# Patient Record
Sex: Female | Born: 1970
Health system: Southern US, Community
[De-identification: ages and names within clinical notes are randomized; demographics above are authoritative.]

## PROBLEM LIST (undated history)

## (undated) DIAGNOSIS — E282 Polycystic ovarian syndrome: Secondary | ICD-10-CM

## (undated) DIAGNOSIS — E785 Hyperlipidemia, unspecified: Secondary | ICD-10-CM

## (undated) DIAGNOSIS — F39 Unspecified mood [affective] disorder: Secondary | ICD-10-CM

## (undated) DIAGNOSIS — E669 Obesity, unspecified: Secondary | ICD-10-CM

## (undated) DIAGNOSIS — Q909 Down syndrome, unspecified: Secondary | ICD-10-CM

## (undated) HISTORY — PX: CYSTOSCOPY: SUR368

## (undated) HISTORY — DX: Polycystic ovarian syndrome: E28.2

## (undated) HISTORY — DX: Unspecified mood (affective) disorder: F39

## (undated) HISTORY — DX: Down syndrome, unspecified: Q90.9

## (undated) HISTORY — DX: Hyperlipidemia, unspecified: E78.5

## (undated) HISTORY — DX: Obesity, unspecified: E66.9

---

## 1998-02-22 ENCOUNTER — Encounter: Admission: RE | Admit: 1998-02-22 | Discharge: 1998-02-22 | Payer: Self-pay | Admitting: Family Medicine

## 1998-03-28 ENCOUNTER — Encounter: Admission: RE | Admit: 1998-03-28 | Discharge: 1998-03-28 | Payer: Self-pay | Admitting: Family Medicine

## 1998-10-12 ENCOUNTER — Encounter: Admission: RE | Admit: 1998-10-12 | Discharge: 1998-10-12 | Payer: Self-pay | Admitting: Family Medicine

## 1999-02-07 ENCOUNTER — Encounter: Admission: RE | Admit: 1999-02-07 | Discharge: 1999-02-07 | Payer: Self-pay | Admitting: Sports Medicine

## 1999-08-15 ENCOUNTER — Encounter: Admission: RE | Admit: 1999-08-15 | Discharge: 1999-08-15 | Payer: Self-pay | Admitting: Sports Medicine

## 1999-10-05 ENCOUNTER — Encounter: Admission: RE | Admit: 1999-10-05 | Discharge: 1999-10-05 | Payer: Self-pay | Admitting: Family Medicine

## 2000-10-31 ENCOUNTER — Encounter: Admission: RE | Admit: 2000-10-31 | Discharge: 2000-10-31 | Payer: Self-pay | Admitting: Family Medicine

## 2000-11-26 ENCOUNTER — Encounter: Admission: RE | Admit: 2000-11-26 | Discharge: 2000-11-26 | Payer: Self-pay | Admitting: Family Medicine

## 2000-12-18 ENCOUNTER — Encounter: Admission: RE | Admit: 2000-12-18 | Discharge: 2000-12-18 | Payer: Self-pay | Admitting: Family Medicine

## 2001-11-03 ENCOUNTER — Encounter: Admission: RE | Admit: 2001-11-03 | Discharge: 2001-11-03 | Payer: Self-pay | Admitting: Family Medicine

## 2002-12-07 ENCOUNTER — Encounter: Admission: RE | Admit: 2002-12-07 | Discharge: 2002-12-07 | Payer: Self-pay | Admitting: Family Medicine

## 2004-02-09 ENCOUNTER — Encounter: Admission: RE | Admit: 2004-02-09 | Discharge: 2004-02-09 | Payer: Self-pay | Admitting: Family Medicine

## 2004-02-25 ENCOUNTER — Encounter: Admission: RE | Admit: 2004-02-25 | Discharge: 2004-02-25 | Payer: Self-pay | Admitting: Sports Medicine

## 2005-03-21 ENCOUNTER — Ambulatory Visit: Payer: Self-pay | Admitting: Family Medicine

## 2006-04-11 ENCOUNTER — Ambulatory Visit: Payer: Self-pay | Admitting: Family Medicine

## 2006-10-31 DIAGNOSIS — E782 Mixed hyperlipidemia: Secondary | ICD-10-CM

## 2007-08-15 ENCOUNTER — Ambulatory Visit: Payer: Self-pay | Admitting: Family Medicine

## 2007-08-19 ENCOUNTER — Encounter (INDEPENDENT_AMBULATORY_CARE_PROVIDER_SITE_OTHER): Payer: Self-pay | Admitting: Family Medicine

## 2007-08-19 ENCOUNTER — Ambulatory Visit: Payer: Self-pay | Admitting: Family Medicine

## 2007-08-19 LAB — CONVERTED CEMR LAB
ALT: 18 units/L (ref 0–35)
AST: 15 units/L (ref 0–37)
Creatinine, Ser: 1.01 mg/dL (ref 0.40–1.20)
LDL Cholesterol: 150 mg/dL — ABNORMAL HIGH (ref 0–99)
Total Bilirubin: 0.3 mg/dL (ref 0.3–1.2)
Total CHOL/HDL Ratio: 4
VLDL: 39 mg/dL (ref 0–40)

## 2007-09-11 ENCOUNTER — Telehealth: Payer: Self-pay | Admitting: *Deleted

## 2008-11-10 ENCOUNTER — Encounter: Payer: Self-pay | Admitting: Family Medicine

## 2008-11-10 ENCOUNTER — Ambulatory Visit: Payer: Self-pay | Admitting: Family Medicine

## 2008-11-10 DIAGNOSIS — E282 Polycystic ovarian syndrome: Secondary | ICD-10-CM | POA: Insufficient documentation

## 2008-11-10 DIAGNOSIS — R5381 Other malaise: Secondary | ICD-10-CM | POA: Insufficient documentation

## 2008-11-10 DIAGNOSIS — Q909 Down syndrome, unspecified: Secondary | ICD-10-CM | POA: Insufficient documentation

## 2008-11-10 DIAGNOSIS — R5383 Other fatigue: Secondary | ICD-10-CM

## 2008-11-16 LAB — CONVERTED CEMR LAB
ALT: 8 units/L (ref 0–35)
AST: 13 units/L (ref 0–37)
Band Neutrophils: 0 % (ref 0–10)
Calcium: 9.1 mg/dL (ref 8.4–10.5)
Chloride: 103 meq/L (ref 96–112)
Creatinine, Ser: 0.87 mg/dL (ref 0.40–1.20)
Eosinophils Absolute: 0 10*3/uL (ref 0.0–0.7)
LDL Cholesterol: 137 mg/dL — ABNORMAL HIGH (ref 0–99)
Lymphocytes Relative: 28 % (ref 12–46)
Lymphs Abs: 1.3 10*3/uL (ref 0.7–4.0)
Neutrophils Relative %: 65 % (ref 43–77)
Potassium: 4.2 meq/L (ref 3.5–5.3)
RBC: 4.24 M/uL (ref 3.87–5.11)
TSH: 1.183 microintl units/mL (ref 0.350–4.500)
Total CHOL/HDL Ratio: 3.7
WBC: 4.8 10*3/uL (ref 4.0–10.5)

## 2008-11-23 ENCOUNTER — Telehealth (INDEPENDENT_AMBULATORY_CARE_PROVIDER_SITE_OTHER): Payer: Self-pay | Admitting: Family Medicine

## 2010-11-09 ENCOUNTER — Encounter: Payer: Self-pay | Admitting: Family Medicine

## 2010-11-30 ENCOUNTER — Ambulatory Visit (INDEPENDENT_AMBULATORY_CARE_PROVIDER_SITE_OTHER): Payer: Medicare Other | Admitting: Family Medicine

## 2010-11-30 ENCOUNTER — Telehealth: Payer: Self-pay | Admitting: Family Medicine

## 2010-11-30 DIAGNOSIS — Z79899 Other long term (current) drug therapy: Secondary | ICD-10-CM | POA: Insufficient documentation

## 2010-11-30 DIAGNOSIS — E7800 Pure hypercholesterolemia, unspecified: Secondary | ICD-10-CM

## 2010-11-30 DIAGNOSIS — Z0000 Encounter for general adult medical examination without abnormal findings: Secondary | ICD-10-CM

## 2010-11-30 DIAGNOSIS — E282 Polycystic ovarian syndrome: Secondary | ICD-10-CM

## 2010-11-30 NOTE — Telephone Encounter (Signed)
Mother had an FL2 form that I completed today, I forgot to put  My NPI in the physicians authorization number spot at the top of the form. Please give this to mother to complete. When she calls back

## 2010-11-30 NOTE — Patient Instructions (Addendum)
I will call you with lab results once I get the ultrasound to review as well Please set up her Mammogram Please set up her Eye appt and Dental Visit Stop the birth control pill for now  Next Physical in 1 year

## 2010-11-30 NOTE — Progress Notes (Signed)
  Subjective:    Patient ID: [name], female    DOB: 1971-01-07, 40 y.o.   MRN: [id number]  HPI Here for physical Exam- does not get PAP Smears-          Due for Mammogram  Seen by [facility] by Julio Sicks NP last year, for exam    Ultrasound performed saw small fibroids, did not comment on ovarian cysts, mother is still not sure if she has diagnosis of PCOS and what to do about hirsutism on chin. No hair elsewhere such as chest/back. Has been on OCP since teenager, more or less to protect her as she has a physical disability. Wants to know if she can take a break off the OCP  Seen by Dr. Isabel Caprice -- 1 week ago , diagnosed with UTI  Given Macrobid   Planning for Cystoscopy with Dr. Porfirio Oar to pain with urination, inability to empty bladder completely and Has some incontinence of feces occ with bladder symptoms. No blood in stool  Mental Health- has mood disorder and insomnia associated with her Down syndrome, has been on mood stabilizers for years, gets yearly blood work done    LMP- currently , has menses every month, sometimes heavier than others  Review of Systems per above, no recent illness, no fever, no emesis, no pain     Objective:   Physical Exam   GEN- NAD, alert and oriented, Down syndrome facies   HEENT- PERRL,EOMI, small pupils unable to complete fundoscopic exam, MMM, fair dentition   Neck -Supple   CVS- RRR, no murmur   RESP- CTAB   ABD- NABS, soft NT/ND, no CVA tenderness  EXT- no edema, pulses 2+  Skin- hirsutism of chin only, no hair on ext or trunk, no genitial hair (per report)  Declined GYN exam       Assessment & Plan:   CPE- PAP not performed as this is traumatic for pt, mother to schedule Mammogram  PCOS- pt has criteria of obesity, hirsutism, has normal periods, will obtain copy of Ultrasound regarding cystic appearance of ovaries. Pt appears to have not reached full tanner staging as a child with her Down syndrome. Will obtain FSH,  TSH, prolactin, Free TEST, DHEA. Metformin has not been shown to help with hirsutism and the OCP have not changed this, possibility of the use of spironolactone but this medications may be difficult for her because of frequent blood draws, currently low normotensive.  - will give holiday off OCP for approx 6 months Will likely need some GYN input about her situation  Hyperlipidemia- recheck FLP, currently not on meds, goal would be LDL < 160 , no other risk factors. Consider adding fish oil  FL2 form completed

## 2010-12-01 ENCOUNTER — Encounter: Payer: Self-pay | Admitting: Family Medicine

## 2010-12-01 LAB — COMPREHENSIVE METABOLIC PANEL
Albumin: 4 g/dL (ref 3.5–5.2)
BUN: 15 mg/dL (ref 6–23)
CO2: 18 mEq/L — ABNORMAL LOW (ref 19–32)
Calcium: 9.2 mg/dL (ref 8.4–10.5)
Chloride: 103 mEq/L (ref 96–112)
Creat: 0.98 mg/dL (ref 0.40–1.20)
Glucose, Bld: 63 mg/dL — ABNORMAL LOW (ref 70–99)
Potassium: 4 mEq/L (ref 3.5–5.3)

## 2010-12-01 LAB — CBC
HCT: 47.2 % — ABNORMAL HIGH (ref 36.0–46.0)
Hemoglobin: 15.4 g/dL — ABNORMAL HIGH (ref 12.0–15.0)
WBC: 8.7 10*3/uL (ref 4.0–10.5)

## 2010-12-01 LAB — TESTOSTERONE, FREE, TOTAL, SHBG: Testosterone-% Free: 0.4 % (ref 0.4–2.4)

## 2010-12-01 LAB — LIPID PANEL
Cholesterol: 248 mg/dL — ABNORMAL HIGH (ref 0–200)
Total CHOL/HDL Ratio: 3.6 Ratio
Triglycerides: 133 mg/dL (ref <150)
VLDL: 27 mg/dL (ref 0–40)

## 2010-12-01 LAB — TSH: TSH: 1.141 u[IU]/mL (ref 0.350–4.500)

## 2010-12-01 LAB — FOLLICLE STIMULATING HORMONE: FSH: 4.2 m[IU]/mL

## 2010-12-01 NOTE — Telephone Encounter (Signed)
Left another message, give my NPI number to mother when she calls

## 2010-12-05 LAB — DHEA

## 2010-12-06 ENCOUNTER — Ambulatory Visit (HOSPITAL_BASED_OUTPATIENT_CLINIC_OR_DEPARTMENT_OTHER)
Admission: RE | Admit: 2010-12-06 | Discharge: 2010-12-06 | Disposition: A | Discharge status: Home / Self Care | Payer: Medicare Other | Source: Ambulatory Visit | Attending: Urology | Admitting: Urology

## 2010-12-06 DIAGNOSIS — N35919 Unspecified urethral stricture, male, unspecified site: Secondary | ICD-10-CM | POA: Insufficient documentation

## 2010-12-06 DIAGNOSIS — Z79899 Other long term (current) drug therapy: Secondary | ICD-10-CM | POA: Insufficient documentation

## 2010-12-06 DIAGNOSIS — N3020 Other chronic cystitis without hematuria: Secondary | ICD-10-CM | POA: Insufficient documentation

## 2010-12-06 DIAGNOSIS — N319 Neuromuscular dysfunction of bladder, unspecified: Secondary | ICD-10-CM | POA: Insufficient documentation

## 2010-12-06 DIAGNOSIS — Z01812 Encounter for preprocedural laboratory examination: Secondary | ICD-10-CM | POA: Insufficient documentation

## 2010-12-06 DIAGNOSIS — Q909 Down syndrome, unspecified: Secondary | ICD-10-CM | POA: Insufficient documentation

## 2010-12-07 LAB — POCT HEMOGLOBIN-HEMACUE: Hemoglobin: 14.4 g/dL (ref 12.0–15.0)

## 2010-12-10 NOTE — Op Note (Signed)
  NAME:  [name], [name] NO.:  [id number]  MEDICAL RECORD NO.:  [id number]          PATIENT TYPE:  LOCATION:                                 FACILITY:  PHYSICIAN:  Valetta Fuller, M.D.       DATE OF BIRTH:  DATE OF PROCEDURE: DATE OF DISCHARGE:                              OPERATIVE REPORT   PREOPERATIVE DIAGNOSES: 1. Chronic cystitis. 2. Neurogenic bladder.  POSTOPERATIVE DIAGNOSES: 1. Chronic cystitis. 2. Neurogenic bladder. 3. Mild urethral stenosis.  PROCEDURE PERFORMED:  Urethral dilation, cystoscopy, and cystogram.  SURGEON:  Valetta Fuller, M.D.  ANESTHESIA:  General.  INDICATIONS:  [name] is 39 years of age.  She suffers from Down syndrome and has had at least a 1-year complaint of some suprapubic lower abdominal pressure primarily with voiding.  Her initial urinalysis was clear, but when we saw her for followup, she did have an evidence of cystitis.  Despite treatment of her cystitis, her symptoms did not resolve.  Because she has Down syndrome, it is difficult to assess her symptoms.  It does appear clinically that she may be having some recurrent bladder spasms.  She presents today for assessment of her urinary tract cystogram and check her urethra.  Full informed consent was obtained from her mother.  TECHNIQUE AND FINDINGS:  The patient received perioperative ciprofloxacin.  She was brought to the operating room and had general anesthesia induced without complication.  She was placed in mid- lithotomy position and prepped and draped in the usual manner. Appropriate surgical time-out was performed.  We found that she did have some urethral mild stenosis and was dilated from 16 to 28 [place]. Cystoscopy revealed a bladder that showed mild trabecular change diffusely.  Capacity was felt to be 600 cc.  Cystogram was performed with 500 cc of contrast and fluoroscopic interpretation.  Bladder outlined appeared relatively smooth with no  evidence of reflux.  The patient was brought to recovery room in stable condition.    Valetta Fuller, M.D.    DSG/MEDQ  D:  12/06/2010  T:  12/07/2010  Job:  [id number]  Electronically Signed by Barron Alvine M.D. on 12/10/2010 01:31:11 PM

## 2010-12-11 ENCOUNTER — Telehealth: Payer: Self-pay | Admitting: Family Medicine

## 2010-12-11 NOTE — Telephone Encounter (Signed)
Left message for mother to return call Labs were normal- I checked her thyroid, prolactin level, testosterone.  Her Ultrasound from [facility], showed a fibroid but her ovaries were normal.  I thinks she still falls within the spectrum of PCOS. There are no meds to help the hair growth that is currently present I also want to let her know that she may or may not notice an increase in hair growth coming off the birth control, which I did not mention at our last visit. I will send a letter

## 2010-12-18 ENCOUNTER — Telehealth: Payer: Self-pay | Admitting: *Deleted

## 2010-12-18 NOTE — Telephone Encounter (Signed)
Message copied by Tessie Fass on Mon Dec 18, 2010 11:10 AM ------      Message from: Milinda Antis      Created: Wed Dec 06, 2010  1:54 PM      Regarding: Records from [facility]             Hello, a medical release was sent for [facility] on [address]      I have not recieived the records back, can we check on this. i need results of ultrasound to compare with her labwork to give diagnosis

## 2010-12-18 NOTE — Telephone Encounter (Signed)
Called and requested records to be faxed.[name], [name]

## 2011-01-01 ENCOUNTER — Encounter: Payer: Self-pay | Admitting: Family Medicine

## 2011-01-01 ENCOUNTER — Telehealth: Payer: Self-pay | Admitting: Family Medicine

## 2011-01-01 NOTE — Telephone Encounter (Signed)
I saw mother in the hall at [facility]. She asked about her daughters labs. Please let her know her thyroid  Hormone was checked at it was normal. Her cholesterol was also checked and this is still elevated. I have sent her a letter with the lab results for her to review and she can think about a cholsterol lowering medication. Call [phone] Please give message

## 2011-12-05 ENCOUNTER — Encounter: Payer: Medicare Other | Admitting: Family Medicine

## 2012-01-11 ENCOUNTER — Encounter: Payer: Self-pay | Admitting: Family Medicine

## 2012-01-11 ENCOUNTER — Ambulatory Visit (INDEPENDENT_AMBULATORY_CARE_PROVIDER_SITE_OTHER): Payer: Medicare Other | Admitting: Family Medicine

## 2012-01-11 VITALS — BP 102/68 | HR 72 | Temp 97.9°F | Ht 59.0 in | Wt 132.0 lb

## 2012-01-11 DIAGNOSIS — E7800 Pure hypercholesterolemia, unspecified: Secondary | ICD-10-CM

## 2012-01-11 DIAGNOSIS — Z01419 Encounter for gynecological examination (general) (routine) without abnormal findings: Secondary | ICD-10-CM | POA: Insufficient documentation

## 2012-01-11 DIAGNOSIS — Z79899 Other long term (current) drug therapy: Secondary | ICD-10-CM

## 2012-01-11 DIAGNOSIS — Z0000 Encounter for general adult medical examination without abnormal findings: Secondary | ICD-10-CM

## 2012-01-11 DIAGNOSIS — F39 Unspecified mood [affective] disorder: Secondary | ICD-10-CM | POA: Insufficient documentation

## 2012-01-11 DIAGNOSIS — Z309 Encounter for contraceptive management, unspecified: Secondary | ICD-10-CM

## 2012-01-11 LAB — COMPREHENSIVE METABOLIC PANEL
ALT: 9 U/L (ref 0–35)
BUN: 14 mg/dL (ref 6–23)
Calcium: 9.3 mg/dL (ref 8.4–10.5)
Creat: 1.01 mg/dL (ref 0.50–1.10)
Glucose, Bld: 74 mg/dL (ref 70–99)
Potassium: 4 mEq/L (ref 3.5–5.3)
Total Protein: 7.2 g/dL (ref 6.0–8.3)

## 2012-01-11 LAB — CBC
Platelets: 348 10*3/uL (ref 150–400)
RDW: 13.6 % (ref 11.5–15.5)
WBC: 5.6 10*3/uL (ref 4.0–10.5)

## 2012-01-11 LAB — LIPID PANEL
Cholesterol: 227 mg/dL — ABNORMAL HIGH (ref 0–200)
HDL: 52 mg/dL (ref >39)
Total CHOL/HDL Ratio: 4.4 Ratio
VLDL: 16 mg/dL (ref 0–40)

## 2012-01-11 LAB — POCT URINE PREGNANCY: Preg Test, Ur: NEGATIVE

## 2012-01-11 LAB — TSH: TSH: 1.626 u[IU]/mL (ref 0.350–4.500)

## 2012-01-11 MED ORDER — TETANUS-DIPHTH-ACELL PERTUSSIS 5-2.5-18.5 LF-MCG/0.5 IM SUSP: 0.5000 mL | Freq: Once | INTRAMUSCULAR | Status: DC

## 2012-01-11 MED ORDER — DESOGESTREL-ETHINYL ESTRADIOL 0.15-0.02/0.01 MG (21/5) PO TABS: 1.0000 | ORAL_TABLET | Freq: Every day | ORAL | Status: DC

## 2012-01-11 NOTE — Patient Instructions (Addendum)
It was great to meet you! I have given you back your paperwork. I have refilled your birth control. We have also given her a tetanus shot today. We checked some labs today-- I will send you a letter with these results. (if we can't draw the blood today, please make a LAB ONLY appointment in the next few weeks to have this done).  Come back in 1 year, sooner for any problems or issues.

## 2012-01-11 NOTE — Assessment & Plan Note (Addendum)
Due for tetanus shot -- Rx for Boostrix printed and handed to mom to have filled at pharmacy.  Has not yet gotten mammogram-- suggested having one done. No paps b/c traumatic.

## 2012-01-11 NOTE — Progress Notes (Signed)
  Subjective:     [name] is a 41 y.o. female and is here for a comprehensive physical exam. The patient reports no problems.  Birth control: has tolerated mircette well in the past, and would like to restart this due to some hair thinning and control of PCOS  Mood disorder/Downs Syndrome: mood has been fine; klonopin needed 2-3 times per week on average; sleep is still an issue-- needing trazodone a few times per week.   Bladder spasms/pain with urination: Was seen by [facility], started on Toviaz ~1 year ago; seems to be helping with pain   History   Social History  . Marital Status: Single    Spouse Name: N/A    Number of Children: N/A  . Years of Education: N/A   Occupational History  . Not on file.   Social History Main Topics  . Smoking status: Never Smoker   . Smokeless tobacco: Not on file  . Alcohol Use: No  . Drug Use: No  . Sexually Active:    Other Topics Concern  . Not on file   Social History Narrative  . No narrative on file   Health Maintenance  Topic Date Due  . Tetanus/tdap  10/14/1989  . Influenza Vaccine  06/03/2012  . Pap Smear  11/29/2013    The following portions of the patient's history were reviewed and updated as appropriate: allergies, current medications, past family history, past medical history, past social history, past surgical history and problem list.  Review of Systems Pertinent items are noted in HPI.   Objective:    There were no vitals taken for this visit. General appearance: alert, cooperative, appears stated age, no distress and syndromic appearance - Down's Head: Normocephalic, without obvious abnormality, atraumatic, syndromic facies, + acne on bilateral cheeks, + hirsutism on chin Eyes: conjunctivae/corneas clear. PERRL, EOM's intact. Fundi benign. Ears: normal TM's and external ear canals both ears Nose: Nares normal. Septum midline. Mucosa normal. No drainage or sinus tenderness. Throat: lips, mucosa, and  tongue normal; teeth and gums normal Neck: no adenopathy, no carotid bruit, supple, symmetrical, trachea midline and thyroid not enlarged, symmetric, no tenderness/mass/nodules Lungs: clear to auscultation bilaterally Heart: regular rate and rhythm, S1, S2 normal, no murmur, click, rub or gallop Abdomen: soft, non-tender; bowel sounds normal; no masses,  no organomegaly Extremities: extremities normal, atraumatic, no cyanosis or edema Pulses: 2+ and symmetric Skin: Skin color, texture, turgor normal. No rashes or lesions Lymph nodes: no cervical LAD    Assessment:    Healthy female exam.  Check labs today as pt on Zyprexa for mood d/o. PAP deferred (as traumatic for pt) U preg negative; birth control refilled today Long term care services form completed and given back to pt/caregiver today     Plan:     See After Visit Summary for Counseling Recommendations

## 2012-01-11 NOTE — Assessment & Plan Note (Signed)
Relatively well controlled. Will check labs today.

## 2012-01-11 NOTE — Assessment & Plan Note (Signed)
Will check FLP

## 2012-01-12 ENCOUNTER — Encounter: Payer: Self-pay | Admitting: Family Medicine

## 2012-01-14 ENCOUNTER — Telehealth: Payer: Self-pay | Admitting: Family Medicine

## 2012-01-14 NOTE — Telephone Encounter (Signed)
Pt's mom is wanting information regarding her daughter getting the Tdap whether or not medicaid will cover due to pt having medicare also. I spoke with BCharlean Sanfilippo and she will follow up with billing.Loralee Pacas Payne

## 2012-03-14 ENCOUNTER — Telehealth: Payer: Self-pay | Admitting: Family Medicine

## 2012-03-14 NOTE — Telephone Encounter (Signed)
Mom is requesting results of labs

## 2012-05-02 ENCOUNTER — Emergency Department (HOSPITAL_COMMUNITY): Payer: Medicare Other

## 2012-05-02 ENCOUNTER — Emergency Department (HOSPITAL_COMMUNITY)
Admission: EM | Admit: 2012-05-02 | Discharge: 2012-05-02 | Disposition: A | Discharge status: Home / Self Care | Payer: Medicare Other | Attending: Emergency Medicine | Admitting: Emergency Medicine

## 2012-05-02 ENCOUNTER — Encounter (HOSPITAL_COMMUNITY): Payer: Self-pay | Admitting: Emergency Medicine

## 2012-05-02 DIAGNOSIS — R454 Irritability and anger: Secondary | ICD-10-CM

## 2012-05-02 DIAGNOSIS — F39 Unspecified mood [affective] disorder: Secondary | ICD-10-CM

## 2012-05-02 DIAGNOSIS — E785 Hyperlipidemia, unspecified: Secondary | ICD-10-CM | POA: Insufficient documentation

## 2012-05-02 DIAGNOSIS — Q909 Down syndrome, unspecified: Secondary | ICD-10-CM | POA: Insufficient documentation

## 2012-05-02 DIAGNOSIS — N390 Urinary tract infection, site not specified: Secondary | ICD-10-CM | POA: Insufficient documentation

## 2012-05-02 DIAGNOSIS — Z79899 Other long term (current) drug therapy: Secondary | ICD-10-CM | POA: Insufficient documentation

## 2012-05-02 DIAGNOSIS — E669 Obesity, unspecified: Secondary | ICD-10-CM | POA: Insufficient documentation

## 2012-05-02 DIAGNOSIS — F911 Conduct disorder, childhood-onset type: Secondary | ICD-10-CM | POA: Insufficient documentation

## 2012-05-02 LAB — URINALYSIS, ROUTINE W REFLEX MICROSCOPIC
Bilirubin Urine: NEGATIVE
Glucose, UA: NEGATIVE mg/dL
Specific Gravity, Urine: 1.024 (ref 1.005–1.030)
pH: 5 (ref 5.0–8.0)

## 2012-05-02 LAB — RAPID URINE DRUG SCREEN, HOSP PERFORMED
Barbiturates: NOT DETECTED
Benzodiazepines: NOT DETECTED
Cocaine: NOT DETECTED
Tetrahydrocannabinol: NOT DETECTED

## 2012-05-02 LAB — CBC WITH DIFFERENTIAL/PLATELET
Basophils Absolute: 0.1 10*3/uL (ref 0.0–0.1)
Eosinophils Absolute: 0.1 10*3/uL (ref 0.0–0.7)
Eosinophils Relative: 1 % (ref 0–5)
Lymphocytes Relative: 14 % (ref 12–46)
MCV: 100 fL (ref 78.0–100.0)
Platelets: 355 10*3/uL (ref 150–400)
RDW: 13.2 % (ref 11.5–15.5)
WBC: 7.4 10*3/uL (ref 4.0–10.5)

## 2012-05-02 LAB — COMPREHENSIVE METABOLIC PANEL
ALT: 16 U/L (ref 0–35)
AST: 15 U/L (ref 0–37)
CO2: 24 mEq/L (ref 19–32)
Calcium: 8.8 mg/dL (ref 8.4–10.5)
Sodium: 135 mEq/L (ref 135–145)
Total Protein: 7.3 g/dL (ref 6.0–8.3)

## 2012-05-02 MED ORDER — DIVALPROEX SODIUM 500 MG PO DR TAB: 500.0000 mg | DELAYED_RELEASE_TABLET | Freq: Two times a day (BID) | ORAL | Status: DC

## 2012-05-02 MED ORDER — CEPHALEXIN 500 MG PO CAPS: 500.0000 mg | ORAL_CAPSULE | Freq: Four times a day (QID) | ORAL | Status: AC

## 2012-05-02 MED ORDER — LORAZEPAM 1 MG PO TABS
2.0000 mg | ORAL_TABLET | Freq: Once | ORAL | Status: AC
  Administered 2012-05-02: 2 mg via ORAL
  Filled 2012-05-02: qty 2

## 2012-05-02 MED ORDER — CEPHALEXIN 500 MG PO CAPS: 500.0000 mg | ORAL_CAPSULE | Freq: Four times a day (QID) | ORAL | Status: DC

## 2012-05-02 MED ORDER — CLONAZEPAM 0.5 MG PO TABS: 1.0000 mg | ORAL_TABLET | Freq: Three times a day (TID) | ORAL | Status: DC | PRN

## 2012-05-02 MED ORDER — CEPHALEXIN 500 MG PO CAPS
500.0000 mg | ORAL_CAPSULE | Freq: Once | ORAL | Status: AC
  Administered 2012-05-02: 500 mg via ORAL
  Filled 2012-05-02: qty 1

## 2012-05-02 MED ORDER — VALPROIC ACID 250 MG PO CAPS
500.0000 mg | ORAL_CAPSULE | Freq: Once | ORAL | Status: AC
  Administered 2012-05-02: 500 mg via ORAL
  Filled 2012-05-02: qty 2

## 2012-05-02 NOTE — ED Notes (Signed)
MD at bedside. 
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
## 2012-05-02 NOTE — ED Notes (Signed)
Pt was resistant to EMS for vitals. Pt kept saying, "No. No. No." Tammy, RN was able to talk pt into taking vitals with food.

## 2012-05-02 NOTE — ED Notes (Signed)
Mom at bedside.

## 2012-05-02 NOTE — ED Notes (Signed)
WUJ:[id number]<JY> Expected date:05/02/12<BR> Expected time: 6:41 AM<BR> Means of arrival:Ambulance<BR> Comments:<BR> EMS-Female from home. Psychosis.

## 2012-05-03 LAB — URINE CULTURE

## 2012-06-13 ENCOUNTER — Ambulatory Visit (INDEPENDENT_AMBULATORY_CARE_PROVIDER_SITE_OTHER): Payer: Medicare Other | Admitting: Family Medicine

## 2012-06-13 VITALS — BP 105/69 | HR 79 | Temp 98.5°F | Ht 59.0 in | Wt 137.0 lb

## 2012-06-13 DIAGNOSIS — Z79899 Other long term (current) drug therapy: Secondary | ICD-10-CM

## 2012-06-13 DIAGNOSIS — L739 Follicular disorder, unspecified: Secondary | ICD-10-CM

## 2012-06-13 DIAGNOSIS — R300 Dysuria: Secondary | ICD-10-CM

## 2012-06-13 DIAGNOSIS — F39 Unspecified mood [affective] disorder: Secondary | ICD-10-CM

## 2012-06-13 DIAGNOSIS — L679 Hair color and hair shaft abnormality, unspecified: Secondary | ICD-10-CM | POA: Insufficient documentation

## 2012-06-13 LAB — POCT URINALYSIS DIPSTICK
Bilirubin, UA: NEGATIVE
Nitrite, UA: NEGATIVE
Spec Grav, UA: 1.02
pH, UA: 6.5

## 2012-06-13 LAB — POCT UA - MICROSCOPIC ONLY

## 2012-06-13 NOTE — Progress Notes (Signed)
S: Pt comes in today for follow up.  Psych would like labs drawn (depakote level, FBS, lipids, LFTs, CBC w/ diff).  Mom also requesting that her "hormones" be checked because of hair on her chin and thinning hair on her head.   ?UTI Pt was seen in the ED on 8/30 for aggressive behavior (known MR), UA done which showed many epis but also many bacteria, + leuk; culture grew 65K of multiple species.  Pt was treated with Keflex but did not complete the abx course due to difficulty getting pt to take her medications.  Sometimes looks like she is uncomfortable with urination.  No fevers, no back pain.   BIRTH CONTROL/Hormones Noticed more hair growth after ~1 month of starting OCPs. Unsure if she needs to stay on them.  Concern is that if she goes off of the OCPs her hormones will be even more "out of whack."  Mom is slightly confrontational about wanting hormone levels checked or having pt sent to GYN to discuss her hormone levels.    ROS: Per HPI  History  Smoking status  . Never Smoker   Smokeless tobacco  . Not on file    O:  Filed Vitals:   06/13/12 0901  BP: 105/69  Pulse: 79  Temp: 98.5 F (36.9 C)    Gen: NAD, talking to self at times CV: RRR, no murmur Pulm: CTA bilat, no wheezes or crackles   A/P: 41 y.o. female p/w hair loss, need for labs -See problem list -f/u in PRN

## 2012-06-13 NOTE — Assessment & Plan Note (Signed)
Labs checked per psych. Will mail letter to pt's mother with results to take to Psych. Mood/agression better on current regimen.

## 2012-06-13 NOTE — Assessment & Plan Note (Signed)
Likely related to PCOS +/- early ovarian failure in pt with downs syndrome. Mom requesting "hormones" be checked (previously had normal testosterone, prolactin, FSH 11/2010, normal TSH 01/2012). D/w Dr. Deirdre Priest who agreed hormone testing was unnecessary and would not change any care plan mgmt.  Mom concerned about thinning hair and asking for things to help this-- could consider topical minoxidil (would need to pay out of pocket).  Could offer hormone testing to mom if she would like to pay out of pocket (no medical reason that would be covered by insurance)

## 2012-06-13 NOTE — Patient Instructions (Addendum)
It was nice to see you again.  I am checking the labs that Psych wants checked.  I will send you a letter with the results.  Your urine does not look infected; I will call if we need to do antibiotics.  Unfortunately, there is nothing we can really do for the hair problems.  I think Janicia is just starting to have more evident accelerated aging.  I discussed checking hormones with the head doctor that is on today, and he agrees that since it will not change our management (and medicare won't pay for it), there is no need to check them.  Please come back as needed!

## 2012-06-15 ENCOUNTER — Encounter: Payer: Self-pay | Admitting: Family Medicine

## 2012-06-15 LAB — URINE CULTURE: Colony Count: 85000

## 2012-06-19 ENCOUNTER — Other Ambulatory Visit: Payer: Medicare Other

## 2012-06-19 DIAGNOSIS — Q909 Down syndrome, unspecified: Secondary | ICD-10-CM

## 2012-06-19 DIAGNOSIS — E7800 Pure hypercholesterolemia, unspecified: Secondary | ICD-10-CM

## 2012-06-19 LAB — COMPREHENSIVE METABOLIC PANEL
AST: 20 U/L (ref 0–37)
Albumin: 3.3 g/dL — ABNORMAL LOW (ref 3.5–5.2)
Alkaline Phosphatase: 60 U/L (ref 39–117)
BUN: 11 mg/dL (ref 6–23)
Creat: 1.05 mg/dL (ref 0.50–1.10)
Potassium: 4.3 mEq/L (ref 3.5–5.3)
Total Bilirubin: 0.3 mg/dL (ref 0.3–1.2)

## 2012-06-19 LAB — LIPID PANEL
HDL: 73 mg/dL (ref >39)
LDL Cholesterol: 165 mg/dL — ABNORMAL HIGH (ref 0–99)
Total CHOL/HDL Ratio: 3.7 Ratio
VLDL: 29 mg/dL (ref 0–40)

## 2012-06-19 NOTE — Progress Notes (Signed)
CMP,FLP,VALPROIC ACID LEVEL DONE TODAY,UNABLE TO OBTAIN CBC WITH DIFF AT THIS TIME.PT WILL RESCHEDULE   [name]

## 2012-06-20 ENCOUNTER — Encounter: Payer: Self-pay | Admitting: Family Medicine

## 2012-06-20 LAB — VALPROIC ACID LEVEL: Valproic Acid Lvl: 27.5 ug/mL — ABNORMAL LOW (ref 50.0–100.0)

## 2012-06-27 ENCOUNTER — Other Ambulatory Visit: Payer: Medicare Other

## 2012-07-03 ENCOUNTER — Other Ambulatory Visit: Payer: Medicare Other

## 2012-12-06 ENCOUNTER — Other Ambulatory Visit: Payer: Self-pay | Admitting: Family Medicine

## 2013-02-02 ENCOUNTER — Encounter: Payer: Self-pay | Admitting: Family Medicine

## 2013-02-02 ENCOUNTER — Ambulatory Visit (INDEPENDENT_AMBULATORY_CARE_PROVIDER_SITE_OTHER): Payer: Medicare Other | Admitting: Family Medicine

## 2013-02-02 VITALS — BP 101/72 | HR 68 | Ht 59.0 in | Wt 152.0 lb

## 2013-02-02 DIAGNOSIS — Z79899 Other long term (current) drug therapy: Secondary | ICD-10-CM

## 2013-02-02 DIAGNOSIS — Z0000 Encounter for general adult medical examination without abnormal findings: Secondary | ICD-10-CM

## 2013-02-02 DIAGNOSIS — L739 Follicular disorder, unspecified: Secondary | ICD-10-CM

## 2013-02-02 DIAGNOSIS — L679 Hair color and hair shaft abnormality, unspecified: Secondary | ICD-10-CM

## 2013-02-02 DIAGNOSIS — Z01419 Encounter for gynecological examination (general) (routine) without abnormal findings: Secondary | ICD-10-CM

## 2013-02-02 MED ORDER — DESOGESTREL-ETHINYL ESTRADIOL 0.15-0.02/0.01 MG (21/5) PO TABS: ORAL_TABLET | ORAL | Status: DC

## 2013-02-02 NOTE — Assessment & Plan Note (Signed)
Check fasting labs per [facility]. F/u 1 year. No paps as they are traumatic for pt.

## 2013-02-02 NOTE — Patient Instructions (Signed)
It was good to see you today.  We are checking the labs-- I will send you a letter with the results.  You can keep her on the birth control pills or take her off-- it is completely up to you.  Bring her back in 1 year for her next well woman visit.

## 2013-02-02 NOTE — Addendum Note (Signed)
Addended by: [place], Maleeyah Mccaughey on: 02/02/2013 05:21 PM   Modules accepted: Orders

## 2013-02-02 NOTE — Progress Notes (Signed)
  Subjective:     [name] is a 42 y.o. female and is here for a comprehensive physical exam. The patient reports problems - :.  Hair loss: hair is shedding- both head and eye brows and nails are thinning.  This has been getting worse over the past 1-2 months.     History   Social History  . Marital Status: Single    Spouse Name: N/A    Number of Children: N/A  . Years of Education: N/A   Occupational History  . Not on file.   Social History Main Topics  . Smoking status: Never Smoker   . Smokeless tobacco: Not on file  . Alcohol Use: No  . Drug Use: No  . Sexually Active:    Other Topics Concern  . Not on file   Social History Narrative  . No narrative on file   Health Maintenance  Topic Date Due  . Tetanus/tdap  10/14/1989  . Influenza Vaccine  05/04/2013  . Pap Smear  11/29/2013    The following portions of the patient's history were reviewed and updated as appropriate: allergies, current medications, past family history, past medical history, past social history, past surgical history and problem list.  Review of Systems Pertinent items are noted in HPI.   Objective:    BP 101/72  Pulse 68  Ht 4\' 11"  (1.499 m)  Wt 152 lb (68.947 kg)  BMI 30.68 kg/m2  LMP 01/23/2013 General appearance: alert, cooperative and no distress Head: Normocephalic, without obvious abnormality, atraumatic Eyes: conjunctivae/corneas clear. PERRL, EOM's intact. Fundi benign. Nose: Nares normal. Septum midline. Mucosa normal. No drainage or sinus tenderness. Throat: lips, mucosa, and tongue normal; teeth and gums normal Neck: no adenopathy, supple, symmetrical, trachea midline and thyroid not enlarged, symmetric, no tenderness/mass/nodules Lungs: clear to auscultation bilaterally Heart: regular rate and rhythm, S1, S2 normal, no murmur, click, rub or gallop Abdomen: soft, non-tender; bowel sounds normal; no masses,  no organomegaly Extremities: extremities normal, atraumatic,  no cyanosis or edema Neurologic: Grossly normal    Assessment:    Healthy female exam. Pap deferred as it is traumatic for patient.      Plan:     See After Visit Summary for Counseling Recommendations

## 2013-02-02 NOTE — Assessment & Plan Note (Signed)
Check TSH with other fasting labs.  Most likely hair changes continue to be related to Downs syndrome and PCOS.  Mom again counseled on this.  Told mom she could consider OTC minoxidil.

## 2013-02-10 ENCOUNTER — Other Ambulatory Visit: Payer: Medicare Other

## 2013-02-10 DIAGNOSIS — Z79899 Other long term (current) drug therapy: Secondary | ICD-10-CM

## 2013-02-10 DIAGNOSIS — L679 Hair color and hair shaft abnormality, unspecified: Secondary | ICD-10-CM

## 2013-02-10 LAB — CBC WITH DIFFERENTIAL/PLATELET
Eosinophils Absolute: 0 10*3/uL (ref 0.0–0.7)
Eosinophils Relative: 1 % (ref 0–5)
Hemoglobin: 12.7 g/dL (ref 12.0–15.0)
Lymphocytes Relative: 21 % (ref 12–46)
Lymphs Abs: 1.6 10*3/uL (ref 0.7–4.0)
MCH: 33.5 pg (ref 26.0–34.0)
MCV: 98.9 fL (ref 78.0–100.0)
Monocytes Relative: 4 % (ref 3–12)
RBC: 3.79 MIL/uL — ABNORMAL LOW (ref 3.87–5.11)
WBC: 7.6 10*3/uL (ref 4.0–10.5)

## 2013-02-10 LAB — LIPID PANEL
Cholesterol: 222 mg/dL — ABNORMAL HIGH (ref 0–200)
HDL: 72 mg/dL (ref >39)
Total CHOL/HDL Ratio: 3.1 Ratio
VLDL: 32 mg/dL (ref 0–40)

## 2013-02-10 LAB — COMPREHENSIVE METABOLIC PANEL
Alkaline Phosphatase: 61 U/L (ref 39–117)
BUN: 11 mg/dL (ref 6–23)
CO2: 27 mEq/L (ref 19–32)
Creat: 1.18 mg/dL — ABNORMAL HIGH (ref 0.50–1.10)
Glucose, Bld: 74 mg/dL (ref 70–99)
Total Bilirubin: 0.2 mg/dL — ABNORMAL LOW (ref 0.3–1.2)

## 2013-02-10 NOTE — Progress Notes (Signed)
TSH,VALPORIC ACID,CMP,CBC WITH DIFF AND FLP DONE TODAY [name]

## 2013-02-12 ENCOUNTER — Encounter: Payer: Self-pay | Admitting: Family Medicine

## 2013-06-15 ENCOUNTER — Other Ambulatory Visit: Payer: Medicare Other

## 2013-07-01 ENCOUNTER — Telehealth: Payer: Self-pay

## 2013-07-01 NOTE — Telephone Encounter (Signed)
Left message that patient needs OV before we can make referral.  Last time patient seen was 6/14 and patient needs to meet new MD.  Please schedule OV with PCP when patient's mother calls back. Thank you.   Zyaire Mccleod, Darlyne Russian, CMA

## 2013-07-01 NOTE — Telephone Encounter (Signed)
Mother request referral to [facility], [phone].  Please call mother once referral has been placed.

## 2013-07-01 NOTE — Telephone Encounter (Signed)
Agree with patient making appointment before referral is made.   Marena Chancy, PGY-3 Family Medicine Resident

## 2014-02-03 ENCOUNTER — Other Ambulatory Visit: Payer: Self-pay | Admitting: Family Medicine

## 2014-02-19 ENCOUNTER — Encounter: Payer: Self-pay | Admitting: Family Medicine

## 2014-02-19 ENCOUNTER — Ambulatory Visit (INDEPENDENT_AMBULATORY_CARE_PROVIDER_SITE_OTHER): Payer: Medicare Other | Admitting: Family Medicine

## 2014-02-19 VITALS — BP 111/63 | HR 75 | Temp 97.5°F | Wt 160.3 lb

## 2014-02-19 DIAGNOSIS — Z79899 Other long term (current) drug therapy: Secondary | ICD-10-CM

## 2014-02-19 DIAGNOSIS — E282 Polycystic ovarian syndrome: Secondary | ICD-10-CM

## 2014-02-19 DIAGNOSIS — R82998 Other abnormal findings in urine: Secondary | ICD-10-CM

## 2014-02-19 DIAGNOSIS — N289 Disorder of kidney and ureter, unspecified: Secondary | ICD-10-CM

## 2014-02-19 DIAGNOSIS — Z0000 Encounter for general adult medical examination without abnormal findings: Secondary | ICD-10-CM

## 2014-02-19 DIAGNOSIS — Z01419 Encounter for gynecological examination (general) (routine) without abnormal findings: Secondary | ICD-10-CM

## 2014-02-19 DIAGNOSIS — Q909 Down syndrome, unspecified: Secondary | ICD-10-CM

## 2014-02-19 DIAGNOSIS — R8290 Unspecified abnormal findings in urine: Secondary | ICD-10-CM

## 2014-02-19 LAB — POCT UA - MICROSCOPIC ONLY

## 2014-02-19 LAB — COMPREHENSIVE METABOLIC PANEL
ALT: 22 U/L (ref 0–35)
AST: 26 U/L (ref 0–37)
Albumin: 3.6 g/dL (ref 3.5–5.2)
Alkaline Phosphatase: 64 U/L (ref 39–117)
BUN: 12 mg/dL (ref 6–23)
CALCIUM: 9.1 mg/dL (ref 8.4–10.5)
CHLORIDE: 102 meq/L (ref 96–112)
CO2: 18 meq/L — AB (ref 19–32)
Creat: 1.12 mg/dL — ABNORMAL HIGH (ref 0.50–1.10)
GLUCOSE: 64 mg/dL — AB (ref 70–99)
Potassium: 4.1 mEq/L (ref 3.5–5.3)
Sodium: 138 mEq/L (ref 135–145)
Total Bilirubin: 0.3 mg/dL (ref 0.2–1.2)
Total Protein: 7.6 g/dL (ref 6.0–8.3)

## 2014-02-19 LAB — POCT URINALYSIS DIPSTICK
BILIRUBIN UA: NEGATIVE
GLUCOSE UA: NEGATIVE
Ketones, UA: NEGATIVE
NITRITE UA: NEGATIVE
RBC UA: NEGATIVE
Spec Grav, UA: 1.025
Urobilinogen, UA: 0.2
pH, UA: 6

## 2014-02-19 LAB — CBC WITH DIFFERENTIAL/PLATELET
Basophils Absolute: 0.1 10*3/uL (ref 0.0–0.1)
Basophils Relative: 1 % (ref 0–1)
Eosinophils Absolute: 0.1 10*3/uL (ref 0.0–0.7)
Eosinophils Relative: 1 % (ref 0–5)
HCT: 38.2 % (ref 36.0–46.0)
HEMOGLOBIN: 13.4 g/dL (ref 12.0–15.0)
LYMPHS PCT: 10 % — AB (ref 12–46)
Lymphs Abs: 0.8 10*3/uL (ref 0.7–4.0)
MCH: 35.2 pg — ABNORMAL HIGH (ref 26.0–34.0)
MCHC: 35.1 g/dL (ref 30.0–36.0)
MCV: 100.3 fL — ABNORMAL HIGH (ref 78.0–100.0)
MONO ABS: 0.2 10*3/uL (ref 0.1–1.0)
MONOS PCT: 2 % — AB (ref 3–12)
NEUTROS ABS: 6.9 10*3/uL (ref 1.7–7.7)
NEUTROS PCT: 86 % — AB (ref 43–77)
Platelets: 334 10*3/uL (ref 150–400)
RBC: 3.81 MIL/uL — ABNORMAL LOW (ref 3.87–5.11)
RDW: 14.7 % (ref 11.5–15.5)
WBC: 8 10*3/uL (ref 4.0–10.5)

## 2014-02-19 LAB — LIPID PANEL
CHOL/HDL RATIO: 2.9 ratio
CHOLESTEROL: 219 mg/dL — AB (ref 0–200)
HDL: 76 mg/dL (ref >39)
LDL Cholesterol: 111 mg/dL — ABNORMAL HIGH (ref 0–99)
Triglycerides: 162 mg/dL — ABNORMAL HIGH (ref <150)
VLDL: 32 mg/dL (ref 0–40)

## 2014-02-19 LAB — POCT GLYCOSYLATED HEMOGLOBIN (HGB A1C): HEMOGLOBIN A1C: 5

## 2014-02-20 LAB — VALPROIC ACID LEVEL: Valproic Acid Lvl: 34.4 ug/mL — ABNORMAL LOW (ref 50.0–100.0)

## 2014-02-20 LAB — URINE CULTURE
Colony Count: NO GROWTH
Organism ID, Bacteria: NO GROWTH

## 2014-02-22 ENCOUNTER — Encounter: Payer: Self-pay | Admitting: Family Medicine

## 2014-02-22 ENCOUNTER — Telehealth: Payer: Self-pay | Admitting: Family Medicine

## 2014-02-22 DIAGNOSIS — N289 Disorder of kidney and ureter, unspecified: Secondary | ICD-10-CM | POA: Insufficient documentation

## 2014-02-22 MED ORDER — FLUCONAZOLE 150 MG PO TABS: 150.0000 mg | ORAL_TABLET | Freq: Once | ORAL | Status: DC

## 2014-02-22 NOTE — Assessment & Plan Note (Addendum)
Patient's mother is very concerned about her daughter's PCOS and management. Her main concern is patient's hair loss.  Patient is on a low dose of estrogen and may benefit from increased estrogen dose.  - since patient's mother is so concerned[facility].  - prior to deciding about referral, mentioned to patient's mother the possibility of spironolactone in setting of hirsuitism as well as metformin. SHe can ask further about these during her GYN visit.  - checking A1C today

## 2014-02-22 NOTE — Progress Notes (Signed)
43 y.o. year old female with h/o Down's syndrome presents for well woman/preventative visit. She is brought in by her mother.   Acute Concerns: - Mom is concerned about her PCOS treatment and whether she is on the right birth control medicine. She would like for her hair loss to be better controled and wonders if it is related to the PCOS.  She has been on pimtrea for 2 years. She has light periods that lasts 3-4 days [facility]. Prior to starting birth control, she did not have regular periods.  - concern about her kidney function from last year's labwork. Not recently dehydrated. No NSAID's. No recent lower ext swelling. No increased or decreased urine output. No dysuria.  - [facility], including CMP, CBC with diff, A1C and lipid panel  Diet: [facility] and at home with her mother Sexual/Birth History: Never sexually active Birth Control: OCP's  Social:  History   Social History  . Marital Status: Single    Spouse Name: N/A    Number of Children: N/A  . Years of Education: N/A   Social History Main Topics  . Smoking status: Never Smoker   . Smokeless tobacco: None  . Alcohol Use: No  . Drug Use: No  . Sexual Activity:    Other Topics Concern  . None   Social History Narrative  . None     Cancer Screening:  Pap Smear: not recently. PAP smears have been difficult to obtain due to patient's difficulty tolerating the exam  Mammogram: none  Colonoscopy:NA  Dexa:NA  Family History: no family history of breast, ovarian or colon cancer.   Physical Exam: Physical Examination: General appearance - alert, well appearing. In mild distress with exams such as breast exam and abdominal exam Mental status - not verbal towards me, but follows commands Eyes - pupils equal and reactive, extraocular eye movements intact Ears - bilateral TM's and external ear canals  normal Nose - normal and patent, no erythema, discharge or polyps Mouth - mucous membranes moist, pharynx normal without lesions Lymphatics - no palpable lymphadenopathy Chest - clear to auscultation, no wheezes, rales or rhonchi, symmetric air entry Heart - normal rate, regular rhythm, normal S1, S2, no murmurs, rubs, clicks or gallops Abdomen - soft, nontender, nondistended, no masses or organomegaly Breasts - breasts appear normal, no suspicious masses, no skin or nipple changes or axillary nodes Extremities - peripheral pulses normal, no pedal edema Skin - facial hair present on chin   ASSESSMENT & PLAN: 43 y.o. female presents for annual well woman/preventative exam and GYN exam. Please see problem specific assessment and plan.

## 2014-02-22 NOTE — Telephone Encounter (Signed)
Please let patient's mother know that the urine showed some evidence of a yeast infection, which can happen without any particular reason. It can be easily treated with a one time pill called diflucan which I am sending to the pharmacy.  She will receive a letter in the mail with the rest of the results.   Thank you!  Marena ChancyStephanie Losq, PGY-3 Family Medicine Resident

## 2014-02-22 NOTE — Telephone Encounter (Signed)
Spoke with patient's mother and informed her of below 

## 2014-02-22 NOTE — Assessment & Plan Note (Signed)
Patient's mother concerned about Creatinine mildly out of range. Recheck BMP and check UA

## 2014-02-22 NOTE — Assessment & Plan Note (Signed)
PAP not obtained today at mother's request due to PAP smears being traumatic for patient.  Recommended that patient get a PAP smear done.  Her mother would prefer for this to be done at the gynecologist's office.  - breast exam normal without family history of breast or ovarian cancer. Hold on mammogram for now.  - Check CMP, CBC with diff, A1C and lipid panel.

## 2014-02-22 NOTE — Assessment & Plan Note (Signed)
Check CMP, CBC with diff, A1C and lipid panel.

## 2014-03-03 ENCOUNTER — Encounter: Payer: Self-pay | Admitting: Obstetrics and Gynecology

## 2014-04-19 ENCOUNTER — Ambulatory Visit (INDEPENDENT_AMBULATORY_CARE_PROVIDER_SITE_OTHER): Payer: Medicare Other | Admitting: Obstetrics & Gynecology

## 2014-04-19 ENCOUNTER — Encounter: Payer: Self-pay | Admitting: Obstetrics and Gynecology

## 2014-04-19 VITALS — BP 91/54 | HR 78 | Ht 59.0 in | Wt 155.6 lb

## 2014-04-19 DIAGNOSIS — Z01419 Encounter for gynecological examination (general) (routine) without abnormal findings: Secondary | ICD-10-CM

## 2014-04-19 DIAGNOSIS — E282 Polycystic ovarian syndrome: Secondary | ICD-10-CM

## 2014-04-19 DIAGNOSIS — L680 Hirsutism: Secondary | ICD-10-CM

## 2014-04-19 NOTE — Patient Instructions (Signed)
Mammography Mammography is an X-ray of the breasts to look for changes that are not normal. The X-ray image is called a mammogram. This procedure can screen for breast cancer, can detect cancer early, and can diagnose cancer.  LET YOUR CAREGIVER KNOW ABOUT:  Breast implants.  Previous breast disease, biopsy, or surgery.  If you are breastfeeding.  Medicines taken, including vitamins, herbs, eyedrops, over-the-counter medicines, and creams.  Use of steroids (by mouth or creams).  Possibility of pregnancy, if this applies. RISKS AND COMPLICATIONS  Exposure to radiation, but at very low levels.  The results may be misinterpreted.  The results may not be accurate.  Mammography may lead to further tests.  Mammography may not catch certain cancers. BEFORE THE PROCEDURE  Schedule your test about 7 days after your menstrual period. This is when your breasts are the least tender and have signs of hormone changes.  If you have had a mammography done at a different facility in the past, get the mammogram X-rays or have them sent to your current exam facility in order to compare them.  Wash your breasts and under your arms the day of the test.  Do not wear deodorants, perfumes, or powders anywhere on your body.  Wear clothes that you can change in and out of easily. PROCEDURE Relax as much as possible during the test. Any discomfort during the test will be very brief. The test should take less than 30 minutes. The following will happen:  You will undress from the waist up and put on a gown.  You will stand in front of the X-ray machine.  Each breast will be placed between 2 plastic or glass plates. The plates will compress your breast for a few seconds.  X-rays will be taken from different angles of the breast. AFTER THE PROCEDURE  The mammogram will be examined.  Depending on the quality of the images, you may need to repeat certain parts of the test.  Ask when your test  results will be ready. Make sure you get your test results.  You may resume normal activities. Document Released: 08/17/2000 Document Revised: 11/12/2011 Document Reviewed: 06/10/2011 ExitCare Patient Information 2015 [employer]. This information is not intended to replace advice given to you by your health care provider. Make sure you discuss any questions you have with your health care provider.  

## 2014-04-19 NOTE — Progress Notes (Signed)
Subjective:     Patient ID: [name], female   DOB: 1970/09/11, 43 y.o.   MRN: [id number]  HPI Pt was dx'd with PCOS and has been on OCP's.  Her mother wants to make sure that she is on the right pill.  She also wants to discuss the hirsutism and hair loss.    Pt has never had a mammogram and has never tolerated a pelvic exam in the ofc but, her mother  has discussed this with her and thinks that we should try today.  Pt with no sx. Pts mother reports that she has never been sexually active and has never been in a situation to be exposed to HPV.   History obtained from the mother.  Past Medical History  Diagnosis Date  . Down syndrome   . Hyperlipidemia   . Mood disorder   . PCOS (polycystic ovarian syndrome)   . Obesity    Current Outpatient Prescriptions on File Prior to Visit  Medication Sig Dispense Refill  . clonazePAM (KLONOPIN) 0.5 MG tablet Take 2 tablets (1 mg total) by mouth 3 (three) times daily as needed. Take 1/2 tablet by mouth as needed for anxiety  30 tablet  0  . fesoterodine (TOVIAZ) 4 MG TB24 Take 4 mg by mouth daily.      . fluconazole (DIFLUCAN) 150 MG tablet Take 1 tablet (150 mg total) by mouth once.  1 tablet  0  . PIMTREA 0.15-0.02/0.01 MG (21/5) tablet take 1 tablet by mouth once daily  28 tablet  11  . QUEtiapine (SEROQUEL) 100 MG tablet Take 200 mg by mouth 3 (three) times daily.      . traZODone (DESYREL) 50 MG tablet Take 20-50 mg by mouth at bedtime. Take 1/2-1 tablest by mouth at bedtime as needed for insomnia      . divalproex (DEPAKOTE) 500 MG DR tablet Take 1 tablet (500 mg total) by mouth 2 (two) times daily.  30 tablet  0  . [DISCONTINUED] desogestrel-ethinyl estradiol (MIRCETTE) 0.15-0.02/0.01 MG (21/5) per tablet Take 1 tablet by mouth daily.         No current facility-administered medications on file prior to visit.        Review of Systems     Objective:   Physical Exam BP 91/54  Pulse 78  Ht 4\' 11"  (1.499 m)  Wt 155 lb 9.6 oz  (70.58 kg)  BMI 31.41 kg/m2  LMP 04/05/2014 Pt in NAD Breast: bilaterally lumpy.  No definitive masses noted; no skin changes    GYN: pt would not tolerate pelvic exam.  Exam aborted   Assessment:     Pt with h/o PCOS and hirsutism, for GYN f/u.   D/W mom pts specific need for Cervical cancer screening. Her risk is very low as pt has never been sexually active her risk is low.  If mother chooses to proceed with pelvic exam would rec it be done under anesthesia.       Plan:     Needs TSH with next blood draw in the FP clinic     Rec cont OCP's Screening mammogram F/u 1 year or sooner prn

## 2014-12-31 ENCOUNTER — Other Ambulatory Visit: Payer: Self-pay | Admitting: *Deleted

## 2014-12-31 NOTE — Telephone Encounter (Signed)
Please have the patient come in for a clinic visit before I can prescribe this medication.  Thanks, [name] S. Dorsey, MD [facility] Resident  12/31/2014, 5:20 PM\

## 2015-01-03 MED ORDER — DESOGESTREL-ETHINYL ESTRADIOL 0.15-0.02/0.01 MG (21/5) PO TABS: 1.0000 | ORAL_TABLET | Freq: Every day | ORAL | Status: DC

## 2015-01-03 NOTE — Telephone Encounter (Signed)
Spoke with mom and made an appt for 02/25/15.  Patient is due for a physical in June so mom would like to get 2 months of medication to last until her appt.  She states that she hasn't missed any pills and takes them as directed.  Jazmin Hartsell,CMA

## 2015-01-03 NOTE — Telephone Encounter (Signed)
Refilled Rx for 2 months. Please ask them to keep her appt on 02/25/15.   Thanks, [name] S. Dorsey, MD [facility] Resident  01/03/2015, 12:31 PM

## 2015-01-03 NOTE — Addendum Note (Signed)
Addended by: Joanna PuffRSEY, CRYSTAL S on: 01/03/2015 12:31 PM   Modules accepted: Orders

## 2015-02-25 ENCOUNTER — Ambulatory Visit (INDEPENDENT_AMBULATORY_CARE_PROVIDER_SITE_OTHER): Payer: Medicare Other | Admitting: Family Medicine

## 2015-02-25 ENCOUNTER — Encounter: Payer: Self-pay | Admitting: Family Medicine

## 2015-02-25 VITALS — BP 109/93 | HR 89 | Temp 98.3°F | Ht 59.0 in | Wt 146.0 lb

## 2015-02-25 DIAGNOSIS — L659 Nonscarring hair loss, unspecified: Secondary | ICD-10-CM

## 2015-02-25 DIAGNOSIS — Z0000 Encounter for general adult medical examination without abnormal findings: Secondary | ICD-10-CM | POA: Diagnosis not present

## 2015-02-25 DIAGNOSIS — E282 Polycystic ovarian syndrome: Secondary | ICD-10-CM

## 2015-02-25 DIAGNOSIS — Z79899 Other long term (current) drug therapy: Secondary | ICD-10-CM

## 2015-02-25 DIAGNOSIS — N289 Disorder of kidney and ureter, unspecified: Secondary | ICD-10-CM

## 2015-02-25 DIAGNOSIS — Z01419 Encounter for gynecological examination (general) (routine) without abnormal findings: Secondary | ICD-10-CM

## 2015-02-25 MED ORDER — DESOGESTREL-ETHINYL ESTRADIOL 0.15-0.02/0.01 MG (21/5) PO TABS: 1.0000 | ORAL_TABLET | Freq: Every day | ORAL | Status: DC

## 2015-02-25 NOTE — Assessment & Plan Note (Signed)
Patient saw gyn last year at the urging of her mother due to concerns for PCOS. No changes were made to her medication regimen. Everything appears to be stable. -Continue Pimtrea, refills given. - Discussed spironolactone and metformin. Dicussed that we'd need to watch the patient's renal function with spironolactone and mother was concerned given her slight SCr bump previously. - Repeating an A1c today.

## 2015-02-25 NOTE — Progress Notes (Signed)
Annual physical BCP refills

## 2015-02-25 NOTE — Assessment & Plan Note (Signed)
Pap not obtained. Per gyn note 1 year ago, pt low risk for cervical cancer.  If mother decides on pap smear in the future, it should be done under anesthesiology No HIV on file but pt not sexually active, no blood transfusions, or high risk behaviors  Will defer mammogram until age 44 CMP, CBC with diff, A1c, and lipid panel

## 2015-02-25 NOTE — Patient Instructions (Signed)

## 2015-02-25 NOTE — Assessment & Plan Note (Signed)
SCr was still slightly out of range at 1.12- mother very concerned about this. Discussed that this could be secondary to hydration. Not hypertensive.  - Pt avoiding NSAIDS - Recheck SCr today with other labs.

## 2015-02-25 NOTE — Assessment & Plan Note (Signed)
Was recently increased from Depakote 500mg  to 750mg  and Seroquel was decreased to 100mg  daily. -Check CMP, CBC with diff, A1c, lipid panel, depakote level, and ammonia level. - follow up at [facility].

## 2015-02-25 NOTE — Progress Notes (Signed)
Patient ID: [name], female   DOB: December 19, 1970, 44 y.o.   MRN: [id number] 44 y.o. year old female with a PMH Down's syndrome presenting for well woman exam. She is brought in by her mother.   Acute Concerns: Concerned about the patient's hair loss and hirsutism. Has discussed this with providers in the past. Never used OTC Mindoxil. Currently on OCPs for PCOS. Mother continues to endorse concerns about kidney function (was 1.12 previously). No NSAIDs, LE swelling. Does note she doesn't like to drink a lot of water.  Nees lab work from [employer]: A1c, LFTs, CBC with diff, depakote level, ammonia  Diet: Well balanced at home and at [employer]. Slowly cutting out sugar from diet and losing weight.   Exercise: Bare minimum of exercise. A few jump and jacks per week.   Sexual/Birth History:  Never sexually active  Birth Control: OCPs for PCOS: was transitioned to [employer]. She has light periods that last approximately 4 days month.   Mood stable on current regimen.  12 point ROS negative besides that noted above.    Social:  History   Social History  . Marital Status: Single    Spouse Name: N/A  . Number of Children: N/A  . Years of Education: N/A   Social History Main Topics  . Smoking status: Never Smoker   . Smokeless tobacco: Not on file  . Alcohol Use: No  . Drug Use: No  . Sexual Activity: Not on file   Other Topics Concern  . None   Social History Narrative    Immunization:  Tdap/TD: Due  Cancer Screening:  Pap Smear: Pt has never been able to tolerate this. Was seen by gyn however unable to perform pelvic exam. Pt low risk for cervical cancer but if it is to be done in the future, it should be done under anesthesia.   Mammogram: never done, would like to wait until 63- no family h/o breast/ovarian cancer.   Physical Exam: VITALS: Blood pressure 109/93, pulse 89, temperature 98.3 F (36.8 C), temperature source Oral, height 4\' 11"  (1.499 m), weight 146 lb  (66.225 kg), last menstrual period 02/02/2015.  GEN: sitting in chair. Pleasant. Appears slightly anxious. Answer yes and no questions. Laughs at some of my questions concerning BMs, nipple discharge, and dysuria.  HEENT: PERRL. EOMI.  MMM, oropharynx clear.External ear canals and TMs with tubes in place. No signs of infection.  No LAD. No thyromegaly CARDIAC: RRR, no m/r/g noted. 2+ DP pulses. No pitting edema RESP: CTAB without wheezing, rhonchi, crackles. Non-labored  ABD: +BS, soft, non-tender. Non-distended. No HSM EXT: Normal bulk and tone.  No deformities noted. 2+ patellar and bicep reflexes.  SKIN: No rashes noted. Hair noted on chin.  Psych: Appropriate mood and affect.  Neuro: Alert. Answers questions appropriately. Follows commands. Symmetric facial movements. 5/5 strength in UE and LE b/l.   ASSESSMENT & PLAN: 44 y.o. female presents for annual well woman/preventative exam. Please see problem specific assessment and plan.

## 2015-02-26 LAB — CBC WITH DIFFERENTIAL/PLATELET
Basophils Absolute: 0.1 10*3/uL (ref 0.0–0.1)
Basophils Relative: 1 % (ref 0–1)
EOS ABS: 0 10*3/uL (ref 0.0–0.7)
EOS PCT: 0 % (ref 0–5)
HEMATOCRIT: 44 % (ref 36.0–46.0)
Hemoglobin: 15.2 g/dL — ABNORMAL HIGH (ref 12.0–15.0)
Lymphocytes Relative: 19 % (ref 12–46)
Lymphs Abs: 1.8 10*3/uL (ref 0.7–4.0)
MCH: 35 pg — AB (ref 26.0–34.0)
MCHC: 34.5 g/dL (ref 30.0–36.0)
MCV: 101.4 fL — AB (ref 78.0–100.0)
Monocytes Absolute: 0.4 10*3/uL (ref 0.1–1.0)
Monocytes Relative: 4 % (ref 3–12)
Neutro Abs: 7.2 10*3/uL (ref 1.7–7.7)
Neutrophils Relative %: 76 % (ref 43–77)
Platelets: 364 10*3/uL (ref 150–400)
RBC: 4.34 MIL/uL (ref 3.87–5.11)
RDW: 13.5 % (ref 11.5–15.5)
WBC: 9.5 10*3/uL (ref 4.0–10.5)

## 2015-02-26 LAB — AMMONIA: Ammonia: 31 umol/L (ref 9–35)

## 2015-02-28 LAB — HEMOGLOBIN A1C
Hgb A1c MFr Bld: 5.5 % (ref 4.8–5.6)
Mean Plasma Glucose: 111 mg/dL

## 2015-03-03 ENCOUNTER — Telehealth: Payer: Self-pay | Admitting: Family Medicine

## 2015-03-03 NOTE — Telephone Encounter (Signed)
Please let the mother know that her paperwork is at the front desk ready to pick up. Additionally,please let her know that I contacted the lab as some of [name]'s results had not returned. Per their report, unfortunately there was not enough blood in the tube so they were unable to run some of tests (looking at her cholesterol and kidney function). Per their report, they did not have her contact information to call and ask [name] come back in. Please have [name] go back to the hospital lab in the future for these labs (the order is still active per their secretary).  [name] S. [name], MD [facility] Resident  03/03/2015, 11:28 AM

## 2015-03-08 NOTE — Telephone Encounter (Signed)
Pt mom informed. Will take her back so to get labs drawn. Deseree Bruna PotterBlount, CMA

## 2015-03-09 ENCOUNTER — Inpatient Hospital Stay (HOSPITAL_COMMUNITY): Admission: RE | Admit: 2015-03-09 | Payer: Self-pay | Source: Ambulatory Visit

## 2015-03-09 ENCOUNTER — Telehealth: Payer: Self-pay | Admitting: *Deleted

## 2015-03-09 LAB — CBC WITH DIFFERENTIAL/PLATELET
BASOS PCT: 1 % (ref 0–1)
Basophils Absolute: 0.1 10*3/uL (ref 0.0–0.1)
EOS PCT: 0 % (ref 0–5)
Eosinophils Absolute: 0 10*3/uL (ref 0.0–0.7)
HCT: 42 % (ref 36.0–46.0)
Hemoglobin: 14.1 g/dL (ref 12.0–15.0)
LYMPHS ABS: 2 10*3/uL (ref 0.7–4.0)
Lymphocytes Relative: 24 % (ref 12–46)
MCH: 34.7 pg — AB (ref 26.0–34.0)
MCHC: 33.6 g/dL (ref 30.0–36.0)
MCV: 103.4 fL — AB (ref 78.0–100.0)
Monocytes Absolute: 0.3 10*3/uL (ref 0.1–1.0)
Monocytes Relative: 3 % (ref 3–12)
NEUTROS PCT: 72 % (ref 43–77)
Neutro Abs: 6.2 10*3/uL (ref 1.7–7.7)
PLATELETS: 341 10*3/uL (ref 150–400)
RBC: 4.06 MIL/uL (ref 3.87–5.11)
RDW: 13.5 % (ref 11.5–15.5)
WBC: 8.6 10*3/uL (ref 4.0–10.5)

## 2015-03-09 LAB — COMPREHENSIVE METABOLIC PANEL
ALT: 18 U/L (ref 14–54)
AST: 28 U/L (ref 15–41)
Albumin: 3.1 g/dL — ABNORMAL LOW (ref 3.5–5.0)
Alkaline Phosphatase: 53 U/L (ref 38–126)
Anion gap: 12 (ref 5–15)
BUN: 11 mg/dL (ref 6–20)
CALCIUM: 8.8 mg/dL — AB (ref 8.9–10.3)
CO2: 24 mmol/L (ref 22–32)
Chloride: 103 mmol/L (ref 101–111)
Creatinine, Ser: 1.07 mg/dL — ABNORMAL HIGH (ref 0.44–1.00)
GFR calc non Af Amer: >60 mL/min (ref >60)
GLUCOSE: 71 mg/dL (ref 65–99)
Potassium: 4.2 mmol/L (ref 3.5–5.1)
SODIUM: 139 mmol/L (ref 135–145)
TOTAL PROTEIN: 7.4 g/dL (ref 6.5–8.1)
Total Bilirubin: 0.5 mg/dL (ref 0.3–1.2)

## 2015-03-09 LAB — VALPROIC ACID LEVEL: Valproic Acid Lvl: 37 ug/mL — ABNORMAL LOW (ref 50.0–100.0)

## 2015-03-09 LAB — AMMONIA: Ammonia: 20 umol/L (ref 9–35)

## 2015-03-09 LAB — TSH: TSH: 3.837 u[IU]/mL (ref 0.350–4.500)

## 2015-03-09 LAB — LIPID PANEL
CHOL/HDL RATIO: 3.6 ratio
CHOLESTEROL: 208 mg/dL — AB (ref 0–200)
HDL: 57 mg/dL (ref >40)
LDL Cholesterol: 119 mg/dL — ABNORMAL HIGH (ref 0–99)
Triglycerides: 162 mg/dL — ABNORMAL HIGH (ref <150)
VLDL: 32 mg/dL (ref 0–40)

## 2015-03-09 NOTE — Telephone Encounter (Signed)
Spoke with someone from the hospital and they said they needed orders. i told them that there were future labs in the computer. Lady on the phone said she didn't have epic/she didn't know how to receive orders. i faxed orders to [phone]. Deseree Bruna PotterBlount, CMA

## 2015-03-10 LAB — HEMOGLOBIN A1C
HEMOGLOBIN A1C: 5.4 % (ref 4.8–5.6)
MEAN PLASMA GLUCOSE: 108 mg/dL

## 2015-03-11 ENCOUNTER — Encounter: Payer: Self-pay | Admitting: Family Medicine

## 2016-01-24 ENCOUNTER — Other Ambulatory Visit: Payer: Self-pay | Admitting: *Deleted

## 2016-01-24 MED ORDER — DESOGESTREL-ETHINYL ESTRADIOL 0.15-0.02/0.01 MG (21/5) PO TABS: 1.0000 | ORAL_TABLET | Freq: Every day | ORAL | Status: DC

## 2016-01-24 NOTE — Telephone Encounter (Signed)
Please call patient to schedule annual exam.  Refill on OCP given for 2 months.

## 2016-01-25 NOTE — Telephone Encounter (Signed)
Contacted pt mother and scheduled pt for 6/26 for annual exam and informed of below. Lamonte SakaiZimmerman Rumple, April D, [place]

## 2016-02-27 ENCOUNTER — Ambulatory Visit (INDEPENDENT_AMBULATORY_CARE_PROVIDER_SITE_OTHER): Payer: Medicare Other | Admitting: Family Medicine

## 2016-02-27 ENCOUNTER — Other Ambulatory Visit (HOSPITAL_COMMUNITY)
Admission: AD | Admit: 2016-02-27 | Discharge: 2016-02-27 | Disposition: A | Discharge status: Home / Self Care | Payer: Medicare Other | Source: Ambulatory Visit | Attending: Family Medicine | Admitting: Family Medicine

## 2016-02-27 VITALS — BP 126/82 | HR 82 | Temp 98.6°F | Ht 59.0 in | Wt 143.2 lb

## 2016-02-27 DIAGNOSIS — Z0000 Encounter for general adult medical examination without abnormal findings: Secondary | ICD-10-CM

## 2016-02-27 DIAGNOSIS — E7800 Pure hypercholesterolemia, unspecified: Secondary | ICD-10-CM | POA: Diagnosis not present

## 2016-02-27 DIAGNOSIS — E282 Polycystic ovarian syndrome: Secondary | ICD-10-CM | POA: Insufficient documentation

## 2016-02-27 DIAGNOSIS — Z01419 Encounter for gynecological examination (general) (routine) without abnormal findings: Secondary | ICD-10-CM

## 2016-02-27 DIAGNOSIS — Z79899 Other long term (current) drug therapy: Secondary | ICD-10-CM | POA: Insufficient documentation

## 2016-02-27 LAB — CBC WITH DIFFERENTIAL/PLATELET
BASOS PCT: 2 %
Basophils Absolute: 0.1 10*3/uL (ref 0.0–0.1)
Eosinophils Absolute: 0.1 10*3/uL (ref 0.0–0.7)
Eosinophils Relative: 1 %
HEMATOCRIT: 43.9 % (ref 36.0–46.0)
Hemoglobin: 14.4 g/dL (ref 12.0–15.0)
LYMPHS ABS: 1.5 10*3/uL (ref 0.7–4.0)
Lymphocytes Relative: 23 %
MCH: 34.4 pg — AB (ref 26.0–34.0)
MCHC: 32.8 g/dL (ref 30.0–36.0)
MCV: 105 fL — AB (ref 78.0–100.0)
MONOS PCT: 3 %
Monocytes Absolute: 0.2 10*3/uL (ref 0.1–1.0)
Neutro Abs: 4.6 10*3/uL (ref 1.7–7.7)
Neutrophils Relative %: 71 %
PLATELETS: ADEQUATE 10*3/uL (ref 150–400)
RBC: 4.18 MIL/uL (ref 3.87–5.11)
RDW: 14 % (ref 11.5–15.5)
WBC: 6.5 10*3/uL (ref 4.0–10.5)

## 2016-02-27 LAB — HEPATIC FUNCTION PANEL
ALT: 11 U/L — AB (ref 14–54)
AST: 20 U/L (ref 15–41)
Albumin: 3 g/dL — ABNORMAL LOW (ref 3.5–5.0)
Alkaline Phosphatase: 50 U/L (ref 38–126)
BILIRUBIN TOTAL: 0.4 mg/dL (ref 0.3–1.2)
Total Protein: 7.3 g/dL (ref 6.5–8.1)

## 2016-02-27 LAB — VALPROIC ACID LEVEL: VALPROIC ACID LVL: 52 ug/mL (ref 50.0–100.0)

## 2016-02-27 LAB — BASIC METABOLIC PANEL
Anion gap: 10 (ref 5–15)
BUN: 11 mg/dL (ref 6–20)
CHLORIDE: 107 mmol/L (ref 101–111)
CO2: 22 mmol/L (ref 22–32)
CREATININE: 1.16 mg/dL — AB (ref 0.44–1.00)
Calcium: 9 mg/dL (ref 8.9–10.3)
GFR calc Af Amer: >60 mL/min (ref >60)
GFR calc non Af Amer: 56 mL/min — ABNORMAL LOW (ref >60)
Glucose, Bld: 61 mg/dL — ABNORMAL LOW (ref 65–99)
POTASSIUM: 4.1 mmol/L (ref 3.5–5.1)
Sodium: 139 mmol/L (ref 135–145)

## 2016-02-27 LAB — LIPID PANEL
Cholesterol: 227 mg/dL — ABNORMAL HIGH (ref 0–200)
HDL: 68 mg/dL (ref >40)
LDL CALC: 128 mg/dL — AB (ref 0–99)
Total CHOL/HDL Ratio: 3.3 RATIO
Triglycerides: 156 mg/dL — ABNORMAL HIGH (ref <150)
VLDL: 31 mg/dL (ref 0–40)

## 2016-02-27 LAB — AMMONIA: Ammonia: 24 umol/L (ref 9–35)

## 2016-02-27 MED ORDER — DESOGESTREL-ETHINYL ESTRADIOL 0.15-0.02/0.01 MG (21/5) PO TABS: 1.0000 | ORAL_TABLET | Freq: Every day | ORAL | Status: DC

## 2016-02-27 MED ORDER — BLOOD GLUCOSE METER KIT: PACK | Status: DC

## 2016-02-27 NOTE — Progress Notes (Signed)
45 y.o. year old female with a PMHx Down's syndrome and mood disorder presenting for well woman/preventative visit.   Acute Concerns: [facility]: A1c, LFTs, CBC with differential, Depakote level, ammonia.  Her mother asked for a prescription for a glucometer, as her mother has been checking her glucose intermittently given concerns for prediabetes. Her last A1c was 5.4, however her mom notes that it has been higher in the past.  Diet: varied and well balanced at home and at [facility].  Exercise: exercise class once per week at day care. Her mother notes that this is the main issue she has.  Sexual/Birth History: Never been sexual active  Birth Control: On OCPs for PCO S. She has light periods monthly that last approximately 3 days.  Complete review of systems performed and negative besides that noted in history of present illness and: Patient continues to have facial hair, however mom is less concerned about it now.  Social:  Social History   Social History  . Marital Status: Single    Spouse Name: N/A  . Number of Children: N/A  . Years of Education: N/A   Social History Main Topics  . Smoking status: Never Smoker   . Smokeless tobacco: Not on file  . Alcohol Use: No  . Drug Use: No  . Sexual Activity: Not on file   Other Topics Concern  . Not on file   Social History Narrative    Immunization:  Patient due for a TDaP Vaccine, would like this today.  Cancer Screening:  Pap Smear: Patient has never been able to tolerate a Pap smear in the clinic. She was seen by gynecology for this as well, however was unable to tolerate it. She is low risk for cervical cancer, and gynecology notes that in order to have a Pap smear done in the future, it would need to be done under anesthesia  Mammogram: Never had one, would like to wait until the age of 45, there is no family history of breast or ovarian cancer  Colonoscopy: No family history of colon  cancer  Physical Exam: VITALS: Reviewed Blood pressure 126/82, pulse 82, temperature 98.6 F (37 C), temperature source Oral, height 4\' 11"  (1.499 m), weight 143 lb 3.2 oz (64.955 kg), SpO2 100 %.  GEN: Pleasant female that appears her stated age, anxious. Answers yes and no to questions. HEENT: Normocephalic, PERRL, EOMI, no scleral icterus, bilateral TM pearly grey, nasal septum midline, MMM, uvula midline, no anterior or posterior lymphadenopathy, no thyromegaly CARDIAC:RRR, S1 and S2 present, no murmur, no heaves/thrills RESP: CTAB, normal effort ABD: soft, no tenderness, normal bowel sounds GU/GYN: Deferred. EXT: No edema, 2+ radial and DP pulses SKIN: no rash  ASSESSMENT & PLAN: 45 y.o. female presents for annual well woman/preventative exam and GYN exam. Please see problem specific assessment and plan.   Well woman exam Pap smear not obtained today. Patient has received not tolerated a Pap smear in our clinic or with gynecology area and she is at a low risk for cervical cancer. Mother declines Pap smear at this time, as in the future he would need to be done under anesthesia. We'll defer mammogram until the age of 45. Patient received a TDap vaccine today Patient has a prescription to obtain a CBC with differential, A1c, lipid panel, Depakote levels [facility]. We'll check her renal function as well.  POLYCYSTIC OVARIAN DISEASE Stable, patient tolerating OCPs well. Will consider metformin in the future. -Continue  OCPs.

## 2016-02-27 NOTE — Patient Instructions (Signed)
Things to do to Keep yourself Healthy - Exercise at least 30-45 minutes a day,  3-4 days a week.  - Eat a low-fat diet with lots of fruits and vegetables, up to 7-9 servings per day. - Seatbelts can save your life. Wear them always. - Smoke detectors on every level of your home, check batteries every year. - Eye Doctor - have an eye exam every 1-2 years - Depression is common in our stressful world.If you're feeling down or losing interest in things you normally enjoy, please come in for a visit. - Violence - If anyone is threatening or hurting you, please call immediately.  Follow up for an immunization appointment to obtain a TDaP vaccine.

## 2016-02-27 NOTE — Assessment & Plan Note (Signed)
Pap smear not obtained today. Patient has received not tolerated a Pap smear in our clinic or with gynecology area and she is at a low risk for cervical cancer. Mother declines Pap smear at this time, as in the future he would need to be done under anesthesia. We'll defer mammogram until the age of 45. Patient received a TDap vaccine today Patient has a prescription to obtain a CBC with differential, A1c, lipid panel, Depakote levels from [facility]. We'll check her renal function as well.

## 2016-02-27 NOTE — Assessment & Plan Note (Signed)
Stable, patient tolerating OCPs well. Will consider metformin in the future. -Continue OCPs.

## 2016-02-28 LAB — HEMOGLOBIN A1C
Hgb A1c MFr Bld: 5 % (ref 4.8–5.6)
Mean Plasma Glucose: 97 mg/dL

## 2016-03-05 ENCOUNTER — Encounter: Payer: Self-pay | Admitting: Family Medicine

## 2016-07-10 ENCOUNTER — Other Ambulatory Visit: Payer: Self-pay | Admitting: Family Medicine

## 2016-10-16 DIAGNOSIS — F28 Other psychotic disorder not due to a substance or known physiological condition: Secondary | ICD-10-CM | POA: Diagnosis not present

## 2016-10-16 DIAGNOSIS — F71 Moderate intellectual disabilities: Secondary | ICD-10-CM | POA: Diagnosis not present

## 2017-01-15 DIAGNOSIS — F28 Other psychotic disorder not due to a substance or known physiological condition: Secondary | ICD-10-CM | POA: Diagnosis not present

## 2017-01-15 DIAGNOSIS — F71 Moderate intellectual disabilities: Secondary | ICD-10-CM | POA: Diagnosis not present

## 2017-02-16 ENCOUNTER — Other Ambulatory Visit: Payer: Self-pay | Admitting: Family Medicine

## 2017-02-18 NOTE — Telephone Encounter (Signed)
Refilled OCPs with 2 month supply.  Pt needs to come in for a annual exam.  Thanks, [name] S. Dorsey, MD [facility] Resident  02/18/2017, 8:44 AM

## 2017-02-18 NOTE — Telephone Encounter (Signed)
Patient has an appt on 02-26-17. [name],CMA

## 2017-02-26 ENCOUNTER — Ambulatory Visit (INDEPENDENT_AMBULATORY_CARE_PROVIDER_SITE_OTHER): Payer: Medicare Other | Admitting: Family Medicine

## 2017-02-26 ENCOUNTER — Other Ambulatory Visit (HOSPITAL_COMMUNITY)
Admission: AD | Admit: 2017-02-26 | Discharge: 2017-02-26 | Disposition: A | Discharge status: Home / Self Care | Payer: Medicare Other | Source: Ambulatory Visit | Attending: Family Medicine | Admitting: Family Medicine

## 2017-02-26 VITALS — BP 120/60 | HR 58 | Temp 97.6°F | Ht 59.0 in | Wt 133.6 lb

## 2017-02-26 DIAGNOSIS — Z79899 Other long term (current) drug therapy: Secondary | ICD-10-CM

## 2017-02-26 DIAGNOSIS — E282 Polycystic ovarian syndrome: Secondary | ICD-10-CM | POA: Diagnosis not present

## 2017-02-26 DIAGNOSIS — Z5181 Encounter for therapeutic drug level monitoring: Secondary | ICD-10-CM | POA: Diagnosis not present

## 2017-02-26 DIAGNOSIS — Z01419 Encounter for gynecological examination (general) (routine) without abnormal findings: Secondary | ICD-10-CM | POA: Diagnosis not present

## 2017-02-26 DIAGNOSIS — Z0000 Encounter for general adult medical examination without abnormal findings: Secondary | ICD-10-CM | POA: Diagnosis not present

## 2017-02-26 DIAGNOSIS — N289 Disorder of kidney and ureter, unspecified: Secondary | ICD-10-CM | POA: Diagnosis not present

## 2017-02-26 LAB — LIPID PANEL
CHOLESTEROL: 208 mg/dL — AB (ref 0–200)
HDL: 60 mg/dL (ref >40)
LDL Cholesterol: 119 mg/dL — ABNORMAL HIGH (ref 0–99)
TRIGLYCERIDES: 143 mg/dL (ref <150)
Total CHOL/HDL Ratio: 3.5 RATIO
VLDL: 29 mg/dL (ref 0–40)

## 2017-02-26 LAB — HEPATIC FUNCTION PANEL
ALT: 19 U/L (ref 14–54)
AST: 22 U/L (ref 15–41)
Albumin: 3.1 g/dL — ABNORMAL LOW (ref 3.5–5.0)
Alkaline Phosphatase: 53 U/L (ref 38–126)
BILIRUBIN DIRECT: 0.1 mg/dL (ref 0.1–0.5)
BILIRUBIN INDIRECT: 0.3 mg/dL (ref 0.3–0.9)
TOTAL PROTEIN: 7.4 g/dL (ref 6.5–8.1)
Total Bilirubin: 0.4 mg/dL (ref 0.3–1.2)

## 2017-02-26 LAB — AMMONIA: AMMONIA: 17 umol/L (ref 9–35)

## 2017-02-26 LAB — VALPROIC ACID LEVEL: Valproic Acid Lvl: 36 ug/mL — ABNORMAL LOW (ref 50.0–100.0)

## 2017-02-26 MED ORDER — DESOGESTREL-ETHINYL ESTRADIOL 0.15-0.02/0.01 MG (21/5) PO TABS: 1.0000 | ORAL_TABLET | Freq: Every day | ORAL | 11 refills | Status: DC

## 2017-02-26 MED ORDER — GLUCOSE BLOOD VI STRP: ORAL_STRIP | 3 refills | Status: DC

## 2017-02-26 NOTE — Assessment & Plan Note (Signed)
Stable. Tolerating OCPs.  - refilled today - could consider sprionolactone in the future if renal function is stable.

## 2017-02-26 NOTE — Assessment & Plan Note (Addendum)
Check SCr today- advise mom that this order was not on her paper prescription from [facility]. Advise mom to make sure the lab at the hospital looks in their system is well to make sure that this is checked.

## 2017-02-26 NOTE — Progress Notes (Signed)
46 y.o. year old female with a PMHx Down's syndrome and mood disorder presenting for well woman/preventative visit.   Acute Concerns: Patient needs lab work from [facility]): A1c, LFTs, CBC with differential, Depakote level, ammonia.  Her last A1c was 5.0, however her mom notes that it has been higher in the past. Needs a refill on test strips.   Diet: varied and well balanced at home and at [facility].  Exercise: exercises intermittently at daycare. Doesn't really like the treadmill.   Sexual/Birth History: Never been sexual active  Birth Control: On OCPs for PCOS. She has light periods monthly that last approximately 3 days.  Complete review of systems performed and negative besides that noted in history of present illness and: Patient continues to have facial hair and a receeding hairline , however mom is less concerned about it now, would like to avoid potential adverse reactions of medications.   Review of Systems  Constitutional: Negative.   HENT: Negative.   Eyes: Negative.   Respiratory: Negative.   Cardiovascular: Negative.   Gastrointestinal: Negative.   Genitourinary: Negative.   Musculoskeletal: Negative.   Skin: Negative.   Neurological: Negative.   Endo/Heme/Allergies: Negative.   Psychiatric/Behavioral: The patient is nervous/anxious and has insomnia.      Social:  Social History   Social History  . Marital status: Single    Spouse name: N/A  . Number of children: N/A  . Years of education: N/A   Social History Main Topics  . Smoking status: Never Smoker  . Smokeless tobacco: Not on file  . Alcohol use No  . Drug use: No  . Sexual activity: Not on file   Other Topics Concern  . Not on file   Social History Narrative  . No narrative on file    Immunization: Patient due for a TDaP Vaccine, insurance will not cover.   Cancer Screening:  Pap Smear: Patient has never been able to tolerate a Pap smear in the clinic. She was  seen by gynecology for this as well, however was unable to tolerate it. She is low risk for cervical cancer, and gynecology notes that in order to have a Pap smear done in the future, it would need to be done under anesthesia. Mom defers this currently.   Mammogram: Never had one, would like to wait until the age of 87, there is no family history of breast or ovarian cancer.   Colonoscopy: No family history of colon cancer  Physical Exam: VITALS: Reviewed Blood pressure 126/82, pulse 82, temperature 98.6 F (37 C), temperature source Oral, height 4\' 11"  (1.499 m), weight 143 lb 3.2 oz (64.955 kg), SpO2 100 %.  GEN: Pleasant female that appears her stated age. Answers yes and no to questions.  HEENT: Normocephalic, PERRL, EOMI, no scleral icterus, eyes a little watery,  bilateral TM pearly grey, nasal septum midline, nose a little erythematous, MMM, uvula midline, no anterior or posterior lymphadenopathy, no thyromegaly CARDIAC:RRR, S1 and S2 present, no murmur, no heaves/thrills RESP: CTAB, normal effort ABD: soft, no tenderness, normal bowel sounds GU/GYN: Deferred. EXT: No edema, 2+ radial and DP pulses SKIN: no rash  ASSESSMENT & PLAN: 46 y.o. female presents for annual well woman/preventative exam. Please see problem specific assessment and plan.   POLYCYSTIC OVARIAN DISEASE Stable. Tolerating OCPs.  - refilled today - could consider sprionolactone in the future if renal function is stable.  Renal insufficiency Check SCr today- advise mom that this order was not on  her paper prescription from [facility]. Advise mom to make sure the lab at the hospital looks in their system is well to make sure that this is checked.  Well woman exam Pap smear not obtained today due to patient and mother choice. Patient is low risk as she is not currently sexually active and never has been. Discussed the risk and benefits of eventually having one performed under anesthesia, will defer for  now. After a discussion, I shared decision was made to defer mammogram until the age of 46. No family history of breast or ovarian cancer. Unfortunately, insurance will not cover the TDap vaccine. Discussed with mom.  Encounter for long-term (current) use of high-risk medication Patient has a prescription to check Depakote level, ammonia level, hepatic function, CBC with differential, A1c, and lipid panel. Mom did not wish for our lab to attempt to draw the blood. Mom will take this prescription from [facility] to the hospital lab. I was informed that this after the patient had left. I called mom and let her know that there was not an order for her renal function. Advised mom to let the lab know to look at both the paper prescription and the orders in the computer so that he can get all blood work at one time.

## 2017-02-26 NOTE — Assessment & Plan Note (Signed)
Patient has a prescription to check Depakote level, ammonia level, hepatic function, CBC with differential, A1c, and lipid panel. Mom did not wish for our lab to attempt to draw the blood. Mom will take this prescription from [facility] to the hospital lab. I was informed that this after the patient had left. I called mom and let her know that there was not an order for her renal function. Advised mom to let the lab know to look at both the paper prescription and the orders in the computer so that he can get all blood work at one time.

## 2017-02-26 NOTE — Assessment & Plan Note (Signed)
Pap smear not obtained today due to patient and mother choice. Patient is low risk as she is not currently sexually active and never has been. Discussed the risk and benefits of eventually having one performed under anesthesia, will defer for now. After a discussion, I shared decision was made to defer mammogram until the age of 46. No family history of breast or ovarian cancer. Unfortunately, insurance will not cover the TDap vaccine. Discussed with mom.

## 2017-02-26 NOTE — Patient Instructions (Signed)
We will check you lab work today.  I will put in my note that it should be faxed to [facility] and a letter sent to you as long as it is resulted by Friday.  Follow up in 1 year or sooner as needed.  Things to do to Keep yourself Healthy  - Exercise at least 30-45 minutes a day,  3-4 days a week.  - Eat a low-fat diet with lots of fruits and vegetables, up to 7-9 servings per day. - Seatbelts can save your life. Wear them always. - Smoke detectors on every level of your home, check batteries every year. - Eye Doctor - have an eye exam every 1-2 years

## 2017-02-27 LAB — CBC
HCT: 44.3 % (ref 36.0–46.0)
Hemoglobin: 14.7 g/dL (ref 12.0–15.0)
MCH: 34.7 pg — AB (ref 26.0–34.0)
MCHC: 33.2 g/dL (ref 30.0–36.0)
MCV: 104.5 fL — AB (ref 78.0–100.0)
PLATELETS: 301 10*3/uL (ref 150–400)
RBC: 4.24 MIL/uL (ref 3.87–5.11)
RDW: 13.9 % (ref 11.5–15.5)
WBC: 6.3 10*3/uL (ref 4.0–10.5)

## 2017-02-27 LAB — DIFFERENTIAL
BASOS PCT: 1 %
Basophils Absolute: 0.1 10*3/uL (ref 0.0–0.1)
EOS PCT: 1 %
Eosinophils Absolute: 0 10*3/uL (ref 0.0–0.7)
Lymphocytes Relative: 29 %
Lymphs Abs: 1.9 10*3/uL (ref 0.7–4.0)
MONO ABS: 0.2 10*3/uL (ref 0.1–1.0)
Monocytes Relative: 4 %
Neutro Abs: 4.3 10*3/uL (ref 1.7–7.7)
Neutrophils Relative %: 65 %

## 2017-02-28 LAB — HEMOGLOBIN A1C
HEMOGLOBIN A1C: 5.2 % (ref 4.8–5.6)
Mean Plasma Glucose: 103 mg/dL

## 2017-05-22 DIAGNOSIS — F71 Moderate intellectual disabilities: Secondary | ICD-10-CM | POA: Diagnosis not present

## 2017-05-22 DIAGNOSIS — F28 Other psychotic disorder not due to a substance or known physiological condition: Secondary | ICD-10-CM | POA: Diagnosis not present

## 2017-08-20 DIAGNOSIS — F28 Other psychotic disorder not due to a substance or known physiological condition: Secondary | ICD-10-CM | POA: Diagnosis not present

## 2017-08-20 DIAGNOSIS — F71 Moderate intellectual disabilities: Secondary | ICD-10-CM | POA: Diagnosis not present

## 2017-11-19 DIAGNOSIS — F28 Other psychotic disorder not due to a substance or known physiological condition: Secondary | ICD-10-CM | POA: Diagnosis not present

## 2017-11-19 DIAGNOSIS — F71 Moderate intellectual disabilities: Secondary | ICD-10-CM | POA: Diagnosis not present

## 2018-01-14 ENCOUNTER — Other Ambulatory Visit: Payer: Self-pay

## 2018-01-14 NOTE — Telephone Encounter (Signed)
Requests 90 day supply.  Alisa Brake, RN ([facility] RN)  

## 2018-01-15 MED ORDER — DESOGESTREL-ETHINYL ESTRADIOL 0.15-0.02/0.01 MG (21/5) PO TABS: 1.0000 | ORAL_TABLET | Freq: Every day | ORAL | 2 refills | Status: DC

## 2018-01-18 ENCOUNTER — Other Ambulatory Visit: Payer: Self-pay | Admitting: Family Medicine

## 2018-01-20 NOTE — Telephone Encounter (Signed)
LMOVM for pt mom to call us back and let us know who prescribed Seroquel since dr Yisroel Ramming hasn't met the pt. They will need an appt before getting a refill. Deseree Kennon Holter, CMA

## 2018-01-20 NOTE — Telephone Encounter (Signed)
Please call patient/family to determine if another provider had historically filled this.  Didn't see anything in her chart. Says not filled since 2013? Never seen by me. I need to see patient in clinic.   Durward Parcel, DO [facility], PGY-2

## 2018-01-21 NOTE — Telephone Encounter (Signed)
Pt's mother called back and states that patients seroquel has already been filled but did not say who rx'd for her.  Shawna Orleans, RN

## 2018-02-27 ENCOUNTER — Other Ambulatory Visit (HOSPITAL_COMMUNITY)
Admission: AD | Admit: 2018-02-27 | Discharge: 2018-02-27 | Disposition: A | Discharge status: Home / Self Care | Payer: Medicare Other | Source: Ambulatory Visit | Attending: Family Medicine | Admitting: Family Medicine

## 2018-02-27 ENCOUNTER — Ambulatory Visit (INDEPENDENT_AMBULATORY_CARE_PROVIDER_SITE_OTHER): Payer: Medicare Other | Admitting: Family Medicine

## 2018-02-27 ENCOUNTER — Encounter: Payer: Self-pay | Admitting: Family Medicine

## 2018-02-27 ENCOUNTER — Encounter: Payer: Medicare Other | Admitting: Family Medicine

## 2018-02-27 VITALS — BP 92/68 | HR 60 | Temp 97.6°F | Ht 59.0 in | Wt 135.4 lb

## 2018-02-27 DIAGNOSIS — Q909 Down syndrome, unspecified: Secondary | ICD-10-CM | POA: Insufficient documentation

## 2018-02-27 DIAGNOSIS — Z0000 Encounter for general adult medical examination without abnormal findings: Secondary | ICD-10-CM

## 2018-02-27 DIAGNOSIS — Z79899 Other long term (current) drug therapy: Secondary | ICD-10-CM | POA: Insufficient documentation

## 2018-02-27 DIAGNOSIS — Z124 Encounter for screening for malignant neoplasm of cervix: Secondary | ICD-10-CM | POA: Diagnosis not present

## 2018-02-27 DIAGNOSIS — E282 Polycystic ovarian syndrome: Secondary | ICD-10-CM | POA: Diagnosis not present

## 2018-02-27 DIAGNOSIS — E782 Mixed hyperlipidemia: Secondary | ICD-10-CM

## 2018-02-27 LAB — LIPID PANEL
CHOL/HDL RATIO: 3.3 ratio
CHOLESTEROL: 242 mg/dL — AB (ref 0–200)
HDL: 73 mg/dL (ref >40)
LDL Cholesterol: 139 mg/dL — ABNORMAL HIGH (ref 0–99)
TRIGLYCERIDES: 149 mg/dL (ref <150)
VLDL: 30 mg/dL (ref 0–40)

## 2018-02-27 LAB — TSH: TSH: 2.924 u[IU]/mL (ref 0.350–4.500)

## 2018-02-27 LAB — BASIC METABOLIC PANEL
ANION GAP: 11 (ref 5–15)
BUN: 11 mg/dL (ref 6–20)
CO2: 24 mmol/L (ref 22–32)
Calcium: 9.1 mg/dL (ref 8.9–10.3)
Chloride: 105 mmol/L (ref 98–111)
Creatinine, Ser: 1.12 mg/dL — ABNORMAL HIGH (ref 0.44–1.00)
GFR calc Af Amer: >60 mL/min (ref >60)
GFR, EST NON AFRICAN AMERICAN: 58 mL/min — AB (ref >60)
Glucose, Bld: 67 mg/dL — ABNORMAL LOW (ref 70–99)
POTASSIUM: 4 mmol/L (ref 3.5–5.1)
Sodium: 140 mmol/L (ref 135–145)

## 2018-02-27 LAB — HEPATIC FUNCTION PANEL
ALBUMIN: 3.3 g/dL — AB (ref 3.5–5.0)
ALT: 13 U/L (ref 0–44)
AST: 22 U/L (ref 15–41)
Alkaline Phosphatase: 46 U/L (ref 38–126)
Bilirubin, Direct: <0.1 mg/dL (ref 0.0–0.2)
TOTAL PROTEIN: 7.7 g/dL (ref 6.5–8.1)
Total Bilirubin: 0.4 mg/dL (ref 0.3–1.2)

## 2018-02-27 LAB — CBC WITH DIFFERENTIAL/PLATELET
Abs Immature Granulocytes: 0 10*3/uL (ref 0.0–0.1)
Basophils Absolute: 0.1 10*3/uL (ref 0.0–0.1)
Basophils Relative: 2 %
EOS PCT: 0 %
Eosinophils Absolute: 0 10*3/uL (ref 0.0–0.7)
HEMATOCRIT: 48.2 % — AB (ref 36.0–46.0)
HEMOGLOBIN: 15.6 g/dL — AB (ref 12.0–15.0)
IMMATURE GRANULOCYTES: 1 %
Lymphocytes Relative: 26 %
Lymphs Abs: 1.8 10*3/uL (ref 0.7–4.0)
MCH: 34.7 pg — ABNORMAL HIGH (ref 26.0–34.0)
MCHC: 32.4 g/dL (ref 30.0–36.0)
MCV: 107.1 fL — AB (ref 78.0–100.0)
MONO ABS: 0.2 10*3/uL (ref 0.1–1.0)
Monocytes Relative: 3 %
NEUTROS ABS: 4.7 10*3/uL (ref 1.7–7.7)
Neutrophils Relative %: 68 %
Platelets: 355 10*3/uL (ref 150–400)
RBC: 4.5 MIL/uL (ref 3.87–5.11)
RDW: 13.4 % (ref 11.5–15.5)
WBC: 6.9 10*3/uL (ref 4.0–10.5)

## 2018-02-27 LAB — HEMOGLOBIN A1C
Hgb A1c MFr Bld: 5.2 % (ref 4.8–5.6)
MEAN PLASMA GLUCOSE: 102.54 mg/dL

## 2018-02-27 LAB — VALPROIC ACID LEVEL: VALPROIC ACID LVL: 40 ug/mL — AB (ref 50.0–100.0)

## 2018-02-27 LAB — AMMONIA: AMMONIA: 38 umol/L — AB (ref 9–35)

## 2018-02-27 MED ORDER — DESOGESTREL-ETHINYL ESTRADIOL 0.15-0.02/0.01 MG (21/5) PO TABS: 1.0000 | ORAL_TABLET | Freq: Every day | ORAL | 2 refills | Status: DC

## 2018-02-27 NOTE — Assessment & Plan Note (Addendum)
No complaints.  Never smoker.  Overdue for Pap smear.  Patient's mother declined times screen for HIV or tetanus booster. - Ambulatory referral to gynecology for evaluation for Pap smear under anesthesia - Deferring HIV and tetanus booster for later visit

## 2018-02-27 NOTE — Patient Instructions (Addendum)
Thank you for coming in to see us today. Please see below to review our plan for today's visit.  1.  [facility]  We have arranged for her to follow-up with Dr. Darin EngelsAbraham from here on out.  You can go [facility] the [facility] to [facility] to [facility] your labs.  I will call you with any abnormal results, otherwise expect to receive results in the mail. 2.  I have placed a referral for Rozell to see a gynecologist to set up a Pap smear under anesthesia.  Expect to receive a phone call within the next week or so.  Please call the clinic at [phone] if your symptoms worsen or you have any concerns. It was our pleasure to serve you.  Durward Parcelavid Glori Machnik, DO [facility], PGY-2

## 2018-02-27 NOTE — Assessment & Plan Note (Addendum)
Patient on Depakote.  Well-controlled managed by mental health. - Checking valproic acid level, hepatic panel, CBC, ammonia, TSH, lipid panel, A1c - Continue Depakote 500 mg daily

## 2018-02-27 NOTE — Assessment & Plan Note (Signed)
Chronic.  Patient monitored by mental health for Depakote use.  On OCP for PCOS.  No history of thyroid disorder.  Does have uncontrolled hyperlipidemia. - Checking TSH, fasting lipid panel - Refill OCP

## 2018-02-27 NOTE — Progress Notes (Signed)
Subjective   Patient ID: [name]    DOB: [birth year], 47 y.o. female   MRN: [id number]  CC: "Annual physical"  HPI: [name] is a 47 y.o. female who presents to clinic today for the following:  Annual physical exam: Patient is here today for her annual physical.  History is obtained by mother given patient's history of Down syndrome and inability to provide history.  Mother states patient does not have any recent issues and is doing well on her medications.  She would like to have her labs checked given her use of Depakote which is been managed by [facility] Granger through mental health services.  Patient follows up with them approximately every 3 months.  Patient is a never smoker.  Mother denies patient having history of fevers or chills, chest pain, shortness of breath, nausea or vomiting, abdominal pain, dysuria, diarrhea or constipation.  Down syndrome: Patient has known PCOS and managed OCPs.  Patient needs refill of her OCPs.  Given her intellectual disability, patient will need to be sedated for Pap smear update based on recommendations by GYN visits concerning her PCOS.  Patient is not sexually active.  ROS: see HPI for pertinent.  PMFSH: Down syndrome, HLD, PCOS, mood disorder. Surgical history unremarkable. Family history unremarkable. Smoking status reviewed. Medications reviewed.  Objective   BP 92/68   Pulse 60   Temp 97.6 F (36.4 C) (Oral)   Ht 4\' 11"  (1.499 m)   Wt 135 lb 6.4 oz (61.4 kg)   SpO2 97%   BMI 27.35 kg/m  Vitals and nursing note reviewed.  General: well nourished, well developed, NAD with non-toxic appearance HEENT: normocephalic, atraumatic, moist mucous membranes Neck: supple, non-tender without lymphadenopathy Cardiovascular: regular rate and rhythm without murmurs, rubs, or gallops Lungs: clear to auscultation bilaterally with normal work of breathing Abdomen: soft, non-tender, non-distended, normoactive bowel sounds Skin: warm, dry, no  rashes or lesions, cap refill < 2 seconds Extremities: warm and well perfused, normal tone, no edema  Assessment & Plan   Encounter for annual physical exam No complaints.  Never smoker.  Overdue for Pap smear.  Patient's mother declined times screen for HIV or tetanus booster. - Ambulatory referral to gynecology for evaluation for Pap smear under anesthesia - Deferring HIV and tetanus booster for later visit  Encounter for long-term (current) use of high-risk medication Patient on Depakote.  Well-controlled managed by mental health. - Checking valproic acid level, hepatic panel, CBC, ammonia, TSH, lipid panel, A1c - Continue Depakote 500 mg daily  Down syndrome Chronic.  Patient monitored by mental health for Depakote use.  On OCP for PCOS.  No history of thyroid disorder.  Does have uncontrolled hyperlipidemia. - Checking TSH, fasting lipid panel - Refill OCP  Orders Placed This Encounter  Procedures  . Basic metabolic panel  . Valproic acid level  . Lipid panel  . Hepatic Function Panel  . CBC with Differential/Platelet  . TSH  . Ammonia  . Hemoglobin A1c  . Ambulatory referral to Gynecology    Referral Priority:   Routine    Referral Type:   Consultation    Referral Reason:   Specialty Services Required    Requested Specialty:   Gynecology    Number of Visits Requested:   1   Meds ordered this encounter  Medications  . desogestrel-ethinyl estradiol (VIORELE) 0.15-0.02/0.01 MG (21/5) tablet    Sig: Take 1 tablet by mouth daily.    Dispense:  84 tablet  Refill:  2    Durward Parcel, DO [facility], PGY-2 02/27/2018, 12:18 PM

## 2018-03-11 ENCOUNTER — Other Ambulatory Visit: Payer: Self-pay

## 2018-03-11 DIAGNOSIS — E282 Polycystic ovarian syndrome: Secondary | ICD-10-CM

## 2018-03-11 MED ORDER — DESOGESTREL-ETHINYL ESTRADIOL 0.15-0.02/0.01 MG (21/5) PO TABS: 1.0000 | ORAL_TABLET | Freq: Every day | ORAL | 2 refills | Status: DC

## 2018-04-24 ENCOUNTER — Encounter: Payer: Self-pay | Admitting: Student

## 2018-05-06 ENCOUNTER — Telehealth: Payer: Self-pay | Admitting: Student

## 2018-05-06 NOTE — Telephone Encounter (Signed)
Called patient and left message for her rescheduled appt. For 9/16 @ 10:35a

## 2018-05-14 ENCOUNTER — Encounter: Payer: Medicare Other | Admitting: Student

## 2018-05-19 ENCOUNTER — Encounter: Payer: Medicare Other | Admitting: Obstetrics & Gynecology

## 2018-05-21 DIAGNOSIS — F29 Unspecified psychosis not due to a substance or known physiological condition: Secondary | ICD-10-CM | POA: Diagnosis not present

## 2018-06-17 ENCOUNTER — Ambulatory Visit: Payer: Medicare Other | Admitting: Obstetrics & Gynecology

## 2018-06-17 NOTE — Progress Notes (Signed)
Per Dr. Harraway Smith pt does not need to be contacted in regards to missed appt.    

## 2018-09-18 DIAGNOSIS — F29 Unspecified psychosis not due to a substance or known physiological condition: Secondary | ICD-10-CM | POA: Diagnosis not present

## 2018-11-03 ENCOUNTER — Telehealth: Payer: Self-pay | Admitting: *Deleted

## 2018-11-03 NOTE — Telephone Encounter (Signed)
Pt mom calls.  Her leg is swelling x 2 days.  Not red or warm to the touch and no fever. Mom denies that pt is acting as it hurts.   Appt made for tomorrow and mom will elevate. Mom really wanted to be seen today but we have no openings.  Advised I would send message to Dr. Darin Engels for additional instructions if applicable.Milas Gain, Maryjo Rochester, CMA

## 2018-11-03 NOTE — Telephone Encounter (Signed)
Thank you for setting this appointment up. Unfortunately it is difficult for me to assess over the phone what is going on without seeing the leg. Cannot rule out cellulitis vs. DVT from that description. Agree with being seen. If patient cannot wait till tomorrow or area gets worse she can go to an urgent care today.   Orpah Clinton, PGY-2 [facility] 11/03/2018 1:17 PM

## 2018-11-04 ENCOUNTER — Other Ambulatory Visit: Payer: Self-pay

## 2018-11-04 ENCOUNTER — Encounter: Payer: Self-pay | Admitting: Family Medicine

## 2018-11-04 ENCOUNTER — Ambulatory Visit (HOSPITAL_BASED_OUTPATIENT_CLINIC_OR_DEPARTMENT_OTHER)
Admission: RE | Admit: 2018-11-04 | Discharge: 2018-11-04 | Disposition: A | Discharge status: Home / Self Care | Payer: Medicare Other | Source: Ambulatory Visit | Attending: Family Medicine | Admitting: Family Medicine

## 2018-11-04 ENCOUNTER — Encounter (HOSPITAL_COMMUNITY): Payer: Self-pay

## 2018-11-04 ENCOUNTER — Observation Stay (HOSPITAL_COMMUNITY)
Admission: EM | Admit: 2018-11-04 | Discharge: 2018-11-05 | Disposition: A | Discharge status: Home / Self Care | Payer: Medicare Other | Attending: Family Medicine | Admitting: Family Medicine

## 2018-11-04 ENCOUNTER — Ambulatory Visit (INDEPENDENT_AMBULATORY_CARE_PROVIDER_SITE_OTHER): Payer: Medicare Other | Admitting: Family Medicine

## 2018-11-04 VITALS — BP 100/70 | HR 72 | Temp 97.5°F | Wt 139.4 lb

## 2018-11-04 DIAGNOSIS — M7989 Other specified soft tissue disorders: Secondary | ICD-10-CM

## 2018-11-04 DIAGNOSIS — E782 Mixed hyperlipidemia: Secondary | ICD-10-CM | POA: Diagnosis not present

## 2018-11-04 DIAGNOSIS — I82412 Acute embolism and thrombosis of left femoral vein: Secondary | ICD-10-CM | POA: Diagnosis not present

## 2018-11-04 DIAGNOSIS — R300 Dysuria: Secondary | ICD-10-CM

## 2018-11-04 DIAGNOSIS — Z79899 Other long term (current) drug therapy: Secondary | ICD-10-CM | POA: Diagnosis not present

## 2018-11-04 DIAGNOSIS — E282 Polycystic ovarian syndrome: Secondary | ICD-10-CM | POA: Insufficient documentation

## 2018-11-04 DIAGNOSIS — I959 Hypotension, unspecified: Secondary | ICD-10-CM

## 2018-11-04 DIAGNOSIS — Q909 Down syndrome, unspecified: Secondary | ICD-10-CM | POA: Diagnosis not present

## 2018-11-04 DIAGNOSIS — N390 Urinary tract infection, site not specified: Secondary | ICD-10-CM | POA: Insufficient documentation

## 2018-11-04 DIAGNOSIS — E669 Obesity, unspecified: Secondary | ICD-10-CM | POA: Diagnosis not present

## 2018-11-04 DIAGNOSIS — F39 Unspecified mood [affective] disorder: Secondary | ICD-10-CM | POA: Insufficient documentation

## 2018-11-04 DIAGNOSIS — I82409 Acute embolism and thrombosis of unspecified deep veins of unspecified lower extremity: Secondary | ICD-10-CM | POA: Diagnosis present

## 2018-11-04 DIAGNOSIS — I80292 Phlebitis and thrombophlebitis of other deep vessels of left lower extremity: Secondary | ICD-10-CM | POA: Diagnosis not present

## 2018-11-04 LAB — POCT URINALYSIS DIP (MANUAL ENTRY)
BILIRUBIN UA: NEGATIVE
Blood, UA: NEGATIVE
GLUCOSE UA: NEGATIVE mg/dL
Ketones, POC UA: NEGATIVE mg/dL
NITRITE UA: NEGATIVE
PH UA: 6.5 (ref 5.0–8.0)
Protein Ur, POC: NEGATIVE mg/dL
Spec Grav, UA: 1.015 (ref 1.010–1.025)
Urobilinogen, UA: 0.2 E.U./dL

## 2018-11-04 LAB — CBC WITH DIFFERENTIAL/PLATELET
Abs Immature Granulocytes: 0.09 10*3/uL — ABNORMAL HIGH (ref 0.00–0.07)
BASOS ABS: 0.1 10*3/uL (ref 0.0–0.1)
Basophils Relative: 1 %
EOS ABS: 0 10*3/uL (ref 0.0–0.5)
EOS PCT: 0 %
HEMATOCRIT: 41.9 % (ref 36.0–46.0)
Hemoglobin: 13.1 g/dL (ref 12.0–15.0)
Immature Granulocytes: 1 %
LYMPHS ABS: 1.4 10*3/uL (ref 0.7–4.0)
Lymphocytes Relative: 14 %
MCH: 33.5 pg (ref 26.0–34.0)
MCHC: 31.3 g/dL (ref 30.0–36.0)
MCV: 107.2 fL — ABNORMAL HIGH (ref 80.0–100.0)
Monocytes Absolute: 0.5 10*3/uL (ref 0.1–1.0)
Monocytes Relative: 5 %
NRBC: 0 % (ref 0.0–0.2)
Neutro Abs: 8.1 10*3/uL — ABNORMAL HIGH (ref 1.7–7.7)
Neutrophils Relative %: 79 %
Platelets: 297 10*3/uL (ref 150–400)
RBC: 3.91 MIL/uL (ref 3.87–5.11)
RDW: 13.8 % (ref 11.5–15.5)
WBC: 10.2 10*3/uL (ref 4.0–10.5)

## 2018-11-04 LAB — COMPREHENSIVE METABOLIC PANEL
ALBUMIN: 2.8 g/dL — AB (ref 3.5–5.0)
ALT: 12 U/L (ref 0–44)
ANION GAP: 11 (ref 5–15)
AST: 20 U/L (ref 15–41)
Alkaline Phosphatase: 56 U/L (ref 38–126)
BILIRUBIN TOTAL: 0.2 mg/dL — AB (ref 0.3–1.2)
BUN: 17 mg/dL (ref 6–20)
CO2: 20 mmol/L — ABNORMAL LOW (ref 22–32)
Calcium: 8.8 mg/dL — ABNORMAL LOW (ref 8.9–10.3)
Chloride: 106 mmol/L (ref 98–111)
Creatinine, Ser: 0.95 mg/dL (ref 0.44–1.00)
GLUCOSE: 90 mg/dL (ref 70–99)
POTASSIUM: 3.9 mmol/L (ref 3.5–5.1)
Sodium: 137 mmol/L (ref 135–145)
TOTAL PROTEIN: 7.2 g/dL (ref 6.5–8.1)

## 2018-11-04 LAB — POCT UA - MICROSCOPIC ONLY

## 2018-11-04 MED ORDER — GLUCOSE BLOOD VI STRP: ORAL_STRIP | 3 refills | Status: DC

## 2018-11-04 MED ORDER — CEPHALEXIN 250 MG/5ML PO SUSR: 500.0000 mg | Freq: Two times a day (BID) | ORAL | 0 refills | Status: DC

## 2018-11-04 NOTE — ED Triage Notes (Signed)
Pt family reports swelling in pts left leg and foot. Was sent to vascular outpatient for a DVT study and it resulted positive for DVT so pt was sent here. No hx of blood clots.

## 2018-11-04 NOTE — Progress Notes (Signed)
Date of Visit: 11/04/2018   HPI:  Patient presents to discuss leg swelling and dysuria.  Leg swelling - mom noticed on Friday that LLE was swollen. Patient has been limping on that leg since then. By Sunday night swelling had increased. No redness. No fevers. Never had anything like this before. No known falls or injuries. Swelling is in entire left leg and foot. No recent travel or immobility.  Dysuria - mom also feels like patient has some discomfort with urination. When she pees, she holds on to her genitals as if it is painful. Reports this was going on when she was seen here last June, but came and went some since that time. Lately it has been more persistent. No vaginal discharge. Sometimes underwear is wet with urine, as if she is holding her urine. No fevers. Eating and drinking well. Mom would like UA to check for UTI. Mom feels like she's gotten a good look at patient's genitals recently with no rashes or abnormalities seen. Patient does not typically do well with pelvic exams in the office.  Mom's contact information: [phone] cell [phone] home  ROS: See HPI.  PMFSH: history of down syndrome, hyperlipidemia, mood disorder, PCOS  PHYSICAL EXAM: BP 100/70   Pulse 72   Temp (!) 97.5 F (36.4 C) (Oral)   Wt 139 lb 6.4 oz (63.2 kg)   SpO2 100%   BMI 28.16 kg/m  Gen: no acute distress, pleasant, cooperative, well appearing HEENT: normocephalic, atraumatic, moist mucous membranes  Heart: regular rate and rhythm, no murmur Lungs: clear to auscultation bilaterally, normal work of breathing  Abdomen: soft, nontender to palpation, no masses or organomegaly Back: no CVA tenderness Neuro: alert, grossly nonfocal Ext: 3+ pitting edema of the entire LLE, with swelling up to the thigh. No erythema, warmth, or tenderness anywhere in the leg. Faint DP pulse palpable on L  ASSESSMENT/PLAN:  Unilateral leg swelling Etiology unclear, but greatest concern is for DVT. Patient is  on estrogen-containing birth control, increasing risk for VTE. No warmth or erythema to raise concern for infection. Less likely also is fracture or trauma, given extensive nature of swelling, lack of traumatic history, and lack of bony tenderness on exam - stat ultrasound of leg to evaluate for DVT - ordered & scheduled for 3pm today - if u/s negative, will proceed with xrays to evaluate for occult fracture or other injury - if u/s positive will need to stop birth control and initiate anticoagulation, likely with xarelto  Dysuria Noted by mom. UA with some signs of infection (some bacteria & WBCs in the urine). Not able to do external genital exam as patient does not tolerate these well (history of down syndrome). Will rx keflex for possible UTI. Send urine for culture.  [place] J. Pollie Meyer, MD [facility]

## 2018-11-04 NOTE — Patient Instructions (Addendum)
It was nice to meet you today!  Getting ultrasound of leg to check for a blood clot If negative will get xrays If shows clot will start blood thinner  Sent in antibiotic for urine  Be well, Dr. Pollie Meyer

## 2018-11-04 NOTE — Progress Notes (Signed)
Left lower extremity venous duplex exam completed.  Result called ordering physician's office spoke with Dr. Leveda Anna, and he gave instruction to patient that it is necessary [facility] to get overnight treatment there.  Preliminary notes is available in CV PROC under chart review.  Teigan Manner H Paulita Licklider(RDMS RVT) 11/04/18 4:17 PM

## 2018-11-05 ENCOUNTER — Encounter (HOSPITAL_COMMUNITY): Payer: Self-pay | Admitting: *Deleted

## 2018-11-05 DIAGNOSIS — I959 Hypotension, unspecified: Secondary | ICD-10-CM | POA: Diagnosis not present

## 2018-11-05 DIAGNOSIS — Q909 Down syndrome, unspecified: Secondary | ICD-10-CM

## 2018-11-05 DIAGNOSIS — I82409 Acute embolism and thrombosis of unspecified deep veins of unspecified lower extremity: Secondary | ICD-10-CM | POA: Diagnosis present

## 2018-11-05 DIAGNOSIS — I82412 Acute embolism and thrombosis of left femoral vein: Secondary | ICD-10-CM | POA: Diagnosis not present

## 2018-11-05 HISTORY — DX: Acute embolism and thrombosis of unspecified deep veins of unspecified lower extremity: I82.409

## 2018-11-05 LAB — BASIC METABOLIC PANEL
Anion gap: 10 (ref 5–15)
BUN: 15 mg/dL (ref 6–20)
CHLORIDE: 105 mmol/L (ref 98–111)
CO2: 21 mmol/L — ABNORMAL LOW (ref 22–32)
Calcium: 8.8 mg/dL — ABNORMAL LOW (ref 8.9–10.3)
Creatinine, Ser: 0.88 mg/dL (ref 0.44–1.00)
GFR calc Af Amer: >60 mL/min (ref >60)
GFR calc non Af Amer: >60 mL/min (ref >60)
Glucose, Bld: 86 mg/dL (ref 70–99)
Potassium: 4.2 mmol/L (ref 3.5–5.1)
SODIUM: 136 mmol/L (ref 135–145)

## 2018-11-05 LAB — CBC
HCT: 40.9 % (ref 36.0–46.0)
Hemoglobin: 13.2 g/dL (ref 12.0–15.0)
MCH: 33.7 pg (ref 26.0–34.0)
MCHC: 32.3 g/dL (ref 30.0–36.0)
MCV: 104.3 fL — ABNORMAL HIGH (ref 80.0–100.0)
Platelets: 284 10*3/uL (ref 150–400)
RBC: 3.92 MIL/uL (ref 3.87–5.11)
RDW: 13.8 % (ref 11.5–15.5)
WBC: 9.9 10*3/uL (ref 4.0–10.5)
nRBC: 0 % (ref 0.0–0.2)

## 2018-11-05 LAB — HIV ANTIBODY (ROUTINE TESTING W REFLEX): HIV Screen 4th Generation wRfx: NONREACTIVE

## 2018-11-05 MED ORDER — SODIUM CHLORIDE 0.9 % IV BOLUS
1000.0000 mL | Freq: Once | INTRAVENOUS | Status: AC
  Administered 2018-11-05: 1000 mL via INTRAVENOUS

## 2018-11-05 MED ORDER — ACETAMINOPHEN 650 MG RE SUPP: 650.0000 mg | Freq: Four times a day (QID) | RECTAL | Status: DC | PRN

## 2018-11-05 MED ORDER — SODIUM CHLORIDE 0.9 % IV SOLN: 250.0000 mL | INTRAVENOUS | Status: DC | PRN

## 2018-11-05 MED ORDER — ACETAMINOPHEN 325 MG PO TABS
650.0000 mg | ORAL_TABLET | Freq: Four times a day (QID) | ORAL | Status: DC | PRN
  Administered 2018-11-05 (×3): 650 mg via ORAL
  Filled 2018-11-05 (×3): qty 2

## 2018-11-05 MED ORDER — SODIUM CHLORIDE 0.9% FLUSH: 3.0000 mL | INTRAVENOUS | Status: DC | PRN

## 2018-11-05 MED ORDER — DIVALPROEX SODIUM ER 500 MG PO TB24
500.0000 mg | ORAL_TABLET | Freq: Every day | ORAL | Status: DC
  Administered 2018-11-05: 500 mg via ORAL
  Filled 2018-11-05: qty 1

## 2018-11-05 MED ORDER — POLYETHYLENE GLYCOL 3350 17 G PO PACK: 17.0000 g | PACK | Freq: Every day | ORAL | Status: DC | PRN

## 2018-11-05 MED ORDER — CEPHALEXIN 500 MG PO CAPS
500.0000 mg | ORAL_CAPSULE | Freq: Two times a day (BID) | ORAL | Status: DC
  Administered 2018-11-05 (×2): 500 mg via ORAL
  Filled 2018-11-05 (×2): qty 1

## 2018-11-05 MED ORDER — TRAZODONE HCL 50 MG PO TABS: 25.0000 mg | ORAL_TABLET | Freq: Every evening | ORAL | Status: DC | PRN

## 2018-11-05 MED ORDER — RIVAROXABAN 15 MG PO TABS
15.0000 mg | ORAL_TABLET | Freq: Two times a day (BID) | ORAL | Status: DC
  Administered 2018-11-05 (×3): 15 mg via ORAL
  Filled 2018-11-05 (×4): qty 1

## 2018-11-05 MED ORDER — CLONAZEPAM 0.25 MG PO TBDP: 0.2500 mg | ORAL_TABLET | Freq: Every day | ORAL | Status: DC | PRN

## 2018-11-05 MED ORDER — RIVAROXABAN (XARELTO) VTE STARTER PACK (15 & 20 MG): ORAL_TABLET | ORAL | 0 refills | Status: DC

## 2018-11-05 MED ORDER — CLONAZEPAM 0.5 MG PO TABS: 0.2500 mg | ORAL_TABLET | Freq: Every day | ORAL | Status: DC | PRN

## 2018-11-05 MED ORDER — SODIUM CHLORIDE 0.9% FLUSH
3.0000 mL | Freq: Two times a day (BID) | INTRAVENOUS | Status: DC
  Administered 2018-11-05 (×2): 3 mL via INTRAVENOUS

## 2018-11-05 MED FILL — XARELTO STARTER PACK: 15 & 20 | 30 days supply | Qty: 51 | Fill #0

## 2018-11-05 NOTE — ED Provider Notes (Signed)
[facility] EMERGENCY DEPARTMENT Provider Note   CSN: [id number] Arrival date & time: 11/04/18  1741    History   Chief Complaint Chief Complaint  Patient presents with  . Leg Swelling    HPI [name] is a 48 y.o. female.     48 yo F with a chief complaint of left leg pain and swelling.  She was seen in her doctor's office today and sent for an outpatient DVT study.  This resulted as positive she was then called and told to come to the ED to be admitted overnight.  Patient has Down syndrome and has limited addition to her history.  Per her primary caregiver she has had no chest pain or shortness of breath no syncopal events no hemoptysis.  She denies recent surgery or hospitalization.  She is on birth control pills.  The history is provided by the patient and a parent.  Illness  Severity:  Mild Onset quality:  Sudden Duration:  2 days Timing:  Constant Progression:  Worsening Chronicity:  New Associated symptoms: no chest pain, no congestion, no fever, no headaches, no myalgias, no nausea, no rhinorrhea, no shortness of breath, no vomiting and no wheezing     Past Medical History:  Diagnosis Date  . Down syndrome   . Hyperlipidemia   . Mood disorder ([place])   . Obesity   . PCOS (polycystic ovarian syndrome)     Patient Active Problem List   Diagnosis Date Noted  . DVT (deep venous thrombosis) ([place]) 11/05/2018  . Renal insufficiency 02/22/2014  . Hair changes 06/13/2012  . Mood disorder ([place]) 01/11/2012  . Encounter for long-term (current) use of high-risk medication 11/30/2010  . PCOS (polycystic ovarian syndrome) 11/10/2008  . Down syndrome 11/10/2008  . Mixed hyperlipidemia 10/31/2006    History reviewed. No pertinent surgical history.   OB History   No obstetric history on file.      Home Medications    Prior to Admission medications   Medication Sig Start Date End Date Taking? Authorizing Provider  Blood Glucose Monitoring  Suppl (ACCU-CHEK AVIVA PLUS) w/Device KIT use as directed 07/10/16   Archie Patten, MD  cephALEXin (KEFLEX) 250 MG/5ML suspension Take 10 mLs (500 mg total) by mouth 2 (two) times daily. 11/04/18   Leeanne Rio, MD  clonazePAM (KLONOPIN) 0.5 MG tablet Take 0.25 mg by mouth daily as needed for anxiety.    [provider]  desogestrel-ethinyl estradiol (VIORELE) 0.15-0.02/0.01 MG (21/5) tablet Take 1 tablet by mouth daily. 03/11/18   Caroline More, DO  divalproex (DEPAKOTE ER) 500 MG 24 hr tablet Take 500 mg by mouth daily.     [provider]  glucose blood (ACCU-CHEK AVIVA) test strip Use to check sugar daily as needed. 11/04/18   Leeanne Rio, MD  traZODone (DESYREL) 50 MG tablet Take 20-50 mg by mouth at bedtime. Take 1/2-1 tablest by mouth at bedtime as needed for insomnia    [provider]  desogestrel-ethinyl estradiol (MIRCETTE) 0.15-0.02/0.01 MG (21/5) per tablet Take 1 tablet by mouth daily.    01/11/12  [provider]    Family History No family history on file.  Social History Social History   Tobacco Use  . Smoking status: Never Smoker  . Smokeless tobacco: Never Used  Substance Use Topics  . Alcohol use: No  . Drug use: No     Allergies   Patient has no known allergies.   Review of Systems Review  of Systems  Constitutional: Negative for chills and fever.  HENT: Negative for congestion and rhinorrhea.   Eyes: Negative for redness and visual disturbance.  Respiratory: Negative for shortness of breath and wheezing.   Cardiovascular: Positive for leg swelling. Negative for chest pain and palpitations.  Gastrointestinal: Negative for nausea and vomiting.  Genitourinary: Negative for dysuria and urgency.  Musculoskeletal: Negative for arthralgias and myalgias.  Skin: Negative for pallor and wound.  Neurological: Negative for dizziness and headaches.     Physical Exam Updated Vital Signs BP 92/66 (BP Location: Left  Wrist)   Pulse 82   Temp 98.4 F (36.9 C) (Oral)   Resp 16   SpO2 100%   Physical Exam Vitals signs and nursing note reviewed.  Constitutional:      General: She is not in acute distress.    Appearance: She is well-developed. She is not diaphoretic.  HENT:     Head: Normocephalic and atraumatic.  Eyes:     Pupils: Pupils are equal, round, and reactive to light.  Neck:     Musculoskeletal: Normal range of motion and neck supple.  Cardiovascular:     Rate and Rhythm: Normal rate and regular rhythm.     Heart sounds: No murmur. No friction rub. No gallop.   Pulmonary:     Effort: Pulmonary effort is normal.     Breath sounds: No wheezing or rales.  Abdominal:     General: There is no distension.     Palpations: Abdomen is soft.     Tenderness: There is no abdominal tenderness.  Musculoskeletal:        General: No tenderness.     Left lower leg: Edema present.     Comments: 4+ edema to the left lower extremity extending up to the thigh.  Pulse motor and sensation intact distally.  Skin:    General: Skin is warm and dry.  Neurological:     Mental Status: She is alert and oriented to person, place, and time.  Psychiatric:        Behavior: Behavior normal.      ED Treatments / Results  Labs (all labs ordered are listed, but only abnormal results are displayed) Labs Reviewed  CBC WITH DIFFERENTIAL/PLATELET - Abnormal; Notable for the following components:      Result Value   MCV 107.2 (*)    Neutro Abs 8.1 (*)    Abs Immature Granulocytes 0.09 (*)    All other components within normal limits  COMPREHENSIVE METABOLIC PANEL - Abnormal; Notable for the following components:   CO2 20 (*)    Calcium 8.8 (*)    Albumin 2.8 (*)    Total Bilirubin 0.2 (*)    All other components within normal limits    EKG None  Radiology Vas [place] Lower Extremity Venous (dvt)  Result Date: 11/04/2018  Lower Venous Study Indications: Pain, and Swelling.  Risk Factors: Patient is in birth  control medication. Limitations: Patient does not cooperate to exam table, the exam was done with patient sitting on a chair. Comparison Study: No comparison study available. Performing Technologist: Rudell Cobb  Examination Guidelines: A complete evaluation includes B-mode imaging, spectral Doppler, color Doppler, and power Doppler as needed of all accessible portions of each vessel. Bilateral testing is considered an integral part of a complete examination. Limited examinations for reoccurring indications may be performed as noted.  Right Venous Findings: +---+---------------+---------+-----------+----------+-------+    CompressibilityPhasicitySpontaneityPropertiesSummary +---+---------------+---------+-----------+----------+-------+ CFVFull  Yes      Yes                          +---+---------------+---------+-----------+----------+-------+  Left Venous Findings: +---------+---------------+---------+-----------+----------+-------------------+          CompressibilityPhasicitySpontaneityPropertiesSummary             +---------+---------------+---------+-----------+----------+-------------------+ CFV      None           No       No                   Acute               +---------+---------------+---------+-----------+----------+-------------------+ SFJ      None                                                             +---------+---------------+---------+-----------+----------+-------------------+ FV Prox  None                                                             +---------+---------------+---------+-----------+----------+-------------------+ FV Mid                                                unable to exam due                                                        to patient                                                                inability to                                                              cooperate            +---------+---------------+---------+-----------+----------+-------------------+ FV Distal                                             unable to exam due  to patient                                                                inability to                                                              cooperate           +---------+---------------+---------+-----------+----------+-------------------+ PFV      None                                                             +---------+---------------+---------+-----------+----------+-------------------+ POP                                                   unable to exam due                                                        to patient                                                                inability to                                                              cooperate           +---------+---------------+---------+-----------+----------+-------------------+ PTV                                                   Not well visualized +---------+---------------+---------+-----------+----------+-------------------+ PERO                                                  Not well visualized +---------+---------------+---------+-----------+----------+-------------------+ GSV      None  Acute               +---------+---------------+---------+-----------+----------+-------------------+ Left external iliac vein also examed, it appears thrombosed with no flow detected.    Summary: Right: No evidence of common femoral vein obstruction. Left: Findings consistent with acute deep vein thrombosis involving the left common femoral vein, left femoral vein, and left proximal profunda vein. Findings consistent with acute superficial vein thrombosis involving the left great saphenous vein. Left   external iliac vein also examed, it appears thrombosed with no flow detected.  *See table(s) above for measurements and observations. Electronically signed by Deitra Mayo MD on 11/04/2018 at 5:58:21 PM.    Final     Procedures Procedures (including critical care time)  Medications Ordered in ED Medications  Rivaroxaban (XARELTO) tablet 15 mg (has no administration in time range)     Initial Impression / Assessment and Plan / ED Course  I have reviewed the triage vital signs and the nursing notes.  Pertinent labs & imaging results that were available during my care of the patient were reviewed by me and considered in my medical decision making (see chart for details).        48 yo F with a chief complaint of left leg pain and swelling.  Found to have acute DVT.  I discussed the case with the family medicine resident who will come and admit the patient.  Family was asking for a liquid blood thinner though I am not sure that one exist, the initial discussion in the doctor's note who saw her earlier was to start her on Xarelto.  We will order that.  She had labs done down here with no renal dysfunction or LFT elevation.  The patients results and plan were reviewed and discussed.   Any x-rays performed were independently reviewed by myself.   Differential diagnosis were considered with the presenting HPI.  Medications  Rivaroxaban (XARELTO) tablet 15 mg (has no administration in time range)    Vitals:   11/04/18 1759 11/04/18 2240  BP: (!) 131/104 92/66  Pulse: 60 82  Resp: 16 16  Temp: 98.4 F (36.9 C)   TempSrc: Oral   SpO2: 100% 100%    Final diagnoses:  DVT femoral (deep venous thrombosis) with thrombophlebitis, left (HCC)    Admission/ observation were discussed with the admitting physician, patient and/or family and they are comfortable with the plan.    Final Clinical Impressions(s) / ED Diagnoses   Final diagnoses:  DVT femoral (deep venous thrombosis)  with thrombophlebitis, left [facility])    ED Discharge Orders    None       Deno Etienne, DO 11/05/18 0024

## 2018-11-05 NOTE — Progress Notes (Signed)
Patient arrived from ED with family at bedside. Alert. Oriented to room and surroundings.

## 2018-11-05 NOTE — Discharge Instructions (Signed)
Information on my medicine - XARELTO (rivaroxaban)  This medication education was reviewed with me or my healthcare representative as part of my discharge preparation.  The pharmacist that spoke with me during my hospital stay was:  [employer], Pharm.D., BCPS  WHY WAS XARELTO PRESCRIBED FOR YOU? Xarelto was prescribed to treat blood clots that may have been found in the veins of your legs (deep vein thrombosis) or in your lungs (pulmonary embolism) and to reduce the risk of them occurring again.  What do you need to know about Xarelto? The starting dose is one 15 mg tablet taken TWICE daily with food for the FIRST 21 DAYS then on 11/26/18  the dose is changed to one 20 mg tablet taken ONCE A DAY with your evening meal.  DO NOT stop taking Xarelto without talking to the health care provider who prescribed the medication.  Refill your prescription for 20 mg tablets before you run out.  After discharge, you should have regular check-up appointments with your healthcare provider that is prescribing your Xarelto.  In the future your dose may need to be changed if your kidney function changes by a significant amount.  What do you do if you miss a dose? If you are taking Xarelto TWICE DAILY and you miss a dose, take it as soon as you remember. You may take two 15 mg tablets (total 30 mg) at the same time then resume your regularly scheduled 15 mg twice daily the next day.  If you are taking Xarelto ONCE DAILY and you miss a dose, take it as soon as you remember on the same day then continue your regularly scheduled once daily regimen the next day. Do not take two doses of Xarelto at the same time.   Important Safety Information Xarelto is a blood thinner medicine that can cause bleeding. You should call your healthcare provider right away if you experience any of the following: ? Bleeding from an injury or your nose that does not stop. ? Unusual colored urine (red or dark brown) or  unusual colored stools (red or black). ? Unusual bruising for unknown reasons. ? A serious fall or if you hit your head (even if there is no bleeding).  Some medicines may interact with Xarelto and might increase your risk of bleeding while on Xarelto. To help avoid this, consult your healthcare provider or pharmacist prior to using any new prescription or non-prescription medications, including herbals, vitamins, non-steroidal anti-inflammatory drugs (NSAIDs) and supplements.  This website has more information on Xarelto: VisitDestination.com.br.

## 2018-11-05 NOTE — Care Management (Signed)
#    5.  S/W    JAN @  [facility]  #   [phone]   XARELTO  20 MG DAILY COVER- YES CO-PAY- $ 3.90 TIER- 3 DRUG PRIOR APPROVAL- NO   MEDICAID  OF [place]  ( ALSO ) EFF-DATE : 02-01-2018 CO-PAY:$3.90   PREFERRED  PHARMACY :  YES [facility]

## 2018-11-05 NOTE — Progress Notes (Signed)
1600 manual BP was 98/70.  1619 discharge order was placed.  1715 IV removed.  Pt ate dinner. Discharge paperwork was discussed with patient and her mom at bedside.   1804 received call that discharge had been cancelled. Michala RN had already taken the wheelchair to the main entrance. Pt was stable at time of discharge and without complaint. Pt had home xarelto delivered by [facility] pharmacy prior to discharge.   Leonidas Romberg, RN

## 2018-11-05 NOTE — Progress Notes (Signed)
Paged on call provider for this patient's low BP.

## 2018-11-05 NOTE — Discharge Summary (Signed)
[facility] Discharge Summary  Patient name: [name] Medical record number: [id number] Date of birth: 11/21/1970 Age: 48 y.o. Gender: female Date of Admission: 11/04/2018  Date of Discharge: 11/05/2018 Admitting Physician: Alveda Reasons, MD  Primary Care Provider: Caroline More, DO Consultants: None  Indication for Hospitalization: Left lower leg swelling  Discharge Diagnoses/Problem List:  Left lower leg DVT PCOS Down syndrome Mood disorder  Disposition: Discharge home with mother  Discharge Condition: Stable  Discharge Exam:  General: No apparent distress, pleasant female Cardio: RRR, S1-S2 present, no murmurs, rubs, gallops Lungs: CTA bilaterally Abdomen: Soft, nontender, bowel sounds 4 quadrants Extremities: Left lower extremity warm with 3+ pitting edema, no particular erythema appreciated, 2+ dorsalis pedis pulses; right lower extremity without edema or evidence of DVT  Brief Hospital Course:  [name] is a 48 year old female admitted for observation with a 5 day history of left lower leg swelling and pain.  Outpatient Doppler showing DVT that extends iliac vein.  She was started on Xarelto 15 mg twice daily for 21 days, with instructions to switch to 20 mg daily on day 22.  She was given a starter pack and patient to take home with her.  Because of the patient's PCO S and her combination birth control as a potential risk factor for DVT, the patient's birth control was stopped.  The patient's blood pressures were slightly low during admission, as low as map 43.  The patient was given a 1 L fluid bolus during the admission and her blood pressures improved with map 80.  She remained asymptomatic with her blood pressures and denied dizziness, headaches, shortness of breath, and chest pain.  She was medically cleared for discharge on 11/05/2018.  Issues for Follow Up:  1. Please follow-up with single-medication birth control  outpatient. 2. Continue Xarelto 50 mg twice daily, followed by 20 mg daily on day 22.  Medication is best taken with food.  Significant Procedures: None  Significant Labs and Imaging:  Recent Labs  Lab 11/04/18 1836 11/05/18 0235  WBC 10.2 9.9  HGB 13.1 13.2  HCT 41.9 40.9  PLT 297 284   Recent Labs  Lab 11/04/18 1836 11/05/18 0235  NA 137 136  K 3.9 4.2  CL 106 105  CO2 20* 21*  GLUCOSE 90 86  BUN 17 15  CREATININE 0.95 0.88  CALCIUM 8.8* 8.8*  ALKPHOS 56  --   AST 20  --   ALT 12  --   ALBUMIN 2.8*  --    Vas [place] Lower Extremity Venous (dvt)  Result Date: 11/04/2018  Lower Venous Study Indications: Pain, and Swelling.  Risk Factors: Patient is in birth control medication. Limitations: Patient does not cooperate to exam table, the exam was done with patient sitting on a chair. Comparison Study: No comparison study available. Performing Technologist: Rudell Cobb  Examination Guidelines: A complete evaluation includes B-mode imaging, spectral Doppler, color Doppler, and power Doppler as needed of all accessible portions of each vessel. Bilateral testing is considered an integral part of a complete examination. Limited examinations for reoccurring indications may be performed as noted.  Right Venous Findings: +---+---------------+---------+-----------+----------+-------+    CompressibilityPhasicitySpontaneityPropertiesSummary +---+---------------+---------+-----------+----------+-------+ CFVFull           Yes      Yes                          +---+---------------+---------+-----------+----------+-------+  Left Venous Findings: +---------+---------------+---------+-----------+----------+-------------------+  CompressibilityPhasicitySpontaneityPropertiesSummary             +---------+---------------+---------+-----------+----------+-------------------+ CFV      None           No       No                   Acute                +---------+---------------+---------+-----------+----------+-------------------+ SFJ      None                                                             +---------+---------------+---------+-----------+----------+-------------------+ FV Prox  None                                                             +---------+---------------+---------+-----------+----------+-------------------+ FV Mid                                                unable to exam due                                                        to patient                                                                inability to                                                              cooperate           +---------+---------------+---------+-----------+----------+-------------------+ FV Distal                                             unable to exam due                                                        to patient  inability to                                                              cooperate           +---------+---------------+---------+-----------+----------+-------------------+ PFV      None                                                             +---------+---------------+---------+-----------+----------+-------------------+ POP                                                   unable to exam due                                                        to patient                                                                inability to                                                              cooperate           +---------+---------------+---------+-----------+----------+-------------------+ PTV                                                   Not well visualized +---------+---------------+---------+-----------+----------+-------------------+ PERO                                                   Not well visualized +---------+---------------+---------+-----------+----------+-------------------+ GSV      None                                         Acute               +---------+---------------+---------+-----------+----------+-------------------+ Left external iliac vein also examed, it appears thrombosed with no flow detected.    Summary: Right: No evidence of common femoral vein obstruction. Left: Findings consistent with acute deep vein thrombosis involving  the left common femoral vein, left femoral vein, and left proximal profunda vein. Findings consistent with acute superficial vein thrombosis involving the left great saphenous vein. Left  external iliac vein also examed, it appears thrombosed with no flow detected.  *See table(s) above for measurements and observations. Electronically signed by Deitra Mayo MD on 11/04/2018 at 5:58:21 PM.    Final    Results/Tests Pending at Time of Discharge: none  Discharge Medications:  Allergies as of 11/05/2018   No Known Allergies     Medication List    STOP taking these medications   desogestrel-ethinyl estradiol 0.15-0.02/0.01 MG (21/5) tablet Commonly known as:  VIORELE     TAKE these medications   ACCU-CHEK AVIVA PLUS w/Device Kit use as directed   cephALEXin 250 MG/5ML suspension Commonly known as:  KEFLEX Take 10 mLs (500 mg total) by mouth 2 (two) times daily.   clonazePAM 0.5 MG tablet Commonly known as:  KLONOPIN Take 0.25 mg by mouth daily as needed for anxiety.   divalproex 500 MG 24 hr tablet Commonly known as:  DEPAKOTE ER Take 500 mg by mouth daily.   glucose blood test strip Commonly known as:  ACCU-CHEK AVIVA Use to check sugar daily as needed.   Rivaroxaban 15 & 20 MG Tbpk Take one 1m tablet by mouth twice daily with food. On Day 22, switch to one 264mtablet once daily with food.   traZODone 50 MG tablet Commonly known as:  DESYREL Take  20-50 mg by mouth at bedtime as needed for sleep. Take 1/2-1 tablest by mouth at bedtime as needed for insomnia      Discharge Instructions: Please refer to Patient Instructions section of EMR for full details.  Patient was counseled important signs and symptoms that should prompt return to medical care, changes in medications, dietary instructions, activity restrictions, and follow up appointments.   Follow-Up Appointments: November 07, 2018 (Friday) at 8:50am with Dr. McLarita FifeHaBetti CruzDO 11/05/2018, 6:03 PM PGY-1, [facility]

## 2018-11-05 NOTE — Discharge Summary (Deleted)
[facility] Discharge Summary  Patient name: [name] Medical record number: [id number] Date of birth: 1971-08-29 Age: 48 y.o. Gender: female Date of Admission: 11/04/2018  Date of Discharge: 11/05/2018 Admitting Physician: Alveda Reasons, MD  Primary Care Provider: Caroline More, DO Consultants: Case management  Indication for Hospitalization: Left lower leg swelling  Discharge Diagnoses/Problem List:  Left lower leg DVT PCOS Down syndrome Mood disorder  Disposition: Discharge home with mother  Discharge Condition: Stable  Discharge Exam:  General: No apparent distress, pleasant female Cardio: RRR, S1-S2 present, no murmurs, rubs, gallops Lungs: CTA bilaterally Abdomen: Soft, nontender, bowel sounds 4 quadrants Extremities: Left lower extremity warm with 3+ pitting edema, no particular erythema appreciated, 2+ dorsalis pedis pulses; right lower extremity without edema or evidence of DVT  Brief Hospital Course:  [name] is a 48 year old female admitted for observation with a 5 day history of left lower leg swelling and pain.  Outpatient Doppler showing DVT that extends iliac vein.  She was started on Xarelto 15 mg twice daily for 21 days, with instructions to switch to 20 mg daily on day 22.  She was given a starter pack and patient to take home with her.  Because of the patient's PCO S and her combination birth control as a potential risk factor for DVT, the patient's birth control was stopped.  The patient's blood pressures were slightly low during admission, as low as map 43.  The patient was given a 1 L fluid bolus during the admission and her blood pressures improved with map 80.  She remained asymptomatic with her blood pressures and denied dizziness, headaches, shortness of breath, and chest pain.  She was medically cleared for discharge on 11/05/2018.  Issues for Follow Up:  1. Please follow-up with single-medication birth control  outpatient. 2. Continue Xarelto 50 mg twice daily, followed by 20 mg daily on day 22.  Medication is best taken with food.  Significant Procedures: None  Significant Labs and Imaging:  Recent Labs  Lab 11/04/18 1836 11/05/18 0235  WBC 10.2 9.9  HGB 13.1 13.2  HCT 41.9 40.9  PLT 297 284   Recent Labs  Lab 11/04/18 1836 11/05/18 0235  NA 137 136  K 3.9 4.2  CL 106 105  CO2 20* 21*  GLUCOSE 90 86  BUN 17 15  CREATININE 0.95 0.88  CALCIUM 8.8* 8.8*  ALKPHOS 56  --   AST 20  --   ALT 12  --   ALBUMIN 2.8*  --    Vas [place] Lower Extremity Venous (dvt)  Result Date: 11/04/2018  Lower Venous Study Indications: Pain, and Swelling.  Risk Factors: Patient is in birth control medication. Limitations: Patient does not cooperate to exam table, the exam was done with patient sitting on a chair. Comparison Study: No comparison study available. Performing Technologist: Rudell Cobb  Examination Guidelines: A complete evaluation includes B-mode imaging, spectral Doppler, color Doppler, and power Doppler as needed of all accessible portions of each vessel. Bilateral testing is considered an integral part of a complete examination. Limited examinations for reoccurring indications may be performed as noted.  Right Venous Findings: +---+---------------+---------+-----------+----------+-------+    CompressibilityPhasicitySpontaneityPropertiesSummary +---+---------------+---------+-----------+----------+-------+ CFVFull           Yes      Yes                          +---+---------------+---------+-----------+----------+-------+  Left Venous Findings: +---------+---------------+---------+-----------+----------+-------------------+  CompressibilityPhasicitySpontaneityPropertiesSummary             +---------+---------------+---------+-----------+----------+-------------------+ CFV      None           No       No                   Acute                +---------+---------------+---------+-----------+----------+-------------------+ SFJ      None                                                             +---------+---------------+---------+-----------+----------+-------------------+ FV Prox  None                                                             +---------+---------------+---------+-----------+----------+-------------------+ FV Mid                                                unable to exam due                                                        to patient                                                                inability to                                                              cooperate           +---------+---------------+---------+-----------+----------+-------------------+ FV Distal                                             unable to exam due                                                        to patient  inability to                                                              cooperate           +---------+---------------+---------+-----------+----------+-------------------+ PFV      None                                                             +---------+---------------+---------+-----------+----------+-------------------+ POP                                                   unable to exam due                                                        to patient                                                                inability to                                                              cooperate           +---------+---------------+---------+-----------+----------+-------------------+ PTV                                                   Not well visualized +---------+---------------+---------+-----------+----------+-------------------+ PERO                                                   Not well visualized +---------+---------------+---------+-----------+----------+-------------------+ GSV      None                                         Acute               +---------+---------------+---------+-----------+----------+-------------------+ Left external iliac vein also examed, it appears thrombosed with no flow detected.    Summary: Right: No evidence of common femoral vein obstruction. Left: Findings consistent with acute deep vein thrombosis involving  the left common femoral vein, left femoral vein, and left proximal profunda vein. Findings consistent with acute superficial vein thrombosis involving the left great saphenous vein. Left  external iliac vein also examed, it appears thrombosed with no flow detected.  *See table(s) above for measurements and observations. Electronically signed by Deitra Mayo MD on 11/04/2018 at 5:58:21 PM.    Final    Results/Tests Pending at Time of Discharge: None  Discharge Medications:  Allergies as of 11/05/2018   No Known Allergies     Medication List    STOP taking these medications   desogestrel-ethinyl estradiol 0.15-0.02/0.01 MG (21/5) tablet Commonly known as:  VIORELE     TAKE these medications   ACCU-CHEK AVIVA PLUS w/Device Kit use as directed   cephALEXin 250 MG/5ML suspension Commonly known as:  KEFLEX Take 10 mLs (500 mg total) by mouth 2 (two) times daily.   clonazePAM 0.5 MG tablet Commonly known as:  KLONOPIN Take 0.25 mg by mouth daily as needed for anxiety.   divalproex 500 MG 24 hr tablet Commonly known as:  DEPAKOTE ER Take 500 mg by mouth daily.   glucose blood test strip Commonly known as:  ACCU-CHEK AVIVA Use to check sugar daily as needed.   Rivaroxaban 15 & 20 MG Tbpk Take one '15mg'$  tablet by mouth twice daily with food. On Day 22, switch to one '20mg'$  tablet once daily with food.   traZODone 50 MG tablet Commonly known as:  DESYREL Take  20-50 mg by mouth at bedtime as needed for sleep. Take 1/2-1 tablest by mouth at bedtime as needed for insomnia      Discharge Instructions: Please refer to Patient Instructions section of EMR for full details.  Patient was counseled important signs and symptoms that should prompt return to medical care, changes in medications, dietary instructions, activity restrictions, and follow up appointments.   Daisy Floro, DO 11/05/2018, 5:08 PM PGY-1, [place]

## 2018-11-05 NOTE — H&P (Addendum)
[facility] Admission History and Physical Service Pager: [phone]  Patient name: [name] Medical record number: [id number] Date of birth: [birth year] Age: 48 y.o. Gender: female  Primary Care Provider: Caroline More, DO Consultants: None  Code Status: FULL   Chief Complaint: left leg pain and swelling   Assessment and Plan: [name] is a 48 y.o. female presenting with left leg pain and swelling. PMH is significant for Down Syndrome, PCOS, HLD and mood disorder.   Left leg DVT - presented to clinic with complaint of left leg swelling and pain that had started three days prior.  Patient was sent for outpatient doppler u/s for dvt evaluation and was found to have a DVT that extends to her iliac vein.  Patient was advised to come to ED to be admitted so she can start anticoagulation. No dyspnea noted. Her mom denies all other complaints. On exam she has significant edema of the left leg going from her foot past her knee.  Left leg was cooler to the touch than the right but pulse was auscultated with vascular doppler while in the room. Her lung exam and heart exam were normal.  Uncertain what triggered her DVT. Most likely it is a result of her increased risk associated with Down Syndrome. Possible May-Thurner syndrome as well.   No recent extended periods of inactivity.  No history of cancer, no family history of cancer.  Patient is on oral contraceptive, which could have increased risk.  - admit to obs, med-surg, Dr. Mingo Amber attending  -started on treatment-dose anticoagulation with Xarelto 15 mg BID x21days; plan to then transition to 20 mg daily  -will d/w pharmacy team to provide xarelto teaching to mother who is guardian  - consider additional imaging given the extent of the clot   - vitals per routine   UTI  Mom had recently noticed strong urine odor and UA in clinic on 3/3 showed 3+ leukocyte esterase, negative nitrites. Patient holding her lower  abdomen when she urinates, mom states that's how she knows she is having pain. No frequency or urgency.  Patient was given prescription for 500 mg keflex BID at her outpatient office visit but was sent to the ED before prescription could be filled.  -Continue Keflex 500 mg Q12 - urine sent for cx   Mood disorder- Stable.  Patient takes depakote 500 daily.  Also taking klonopin PRN as needed at home along with trazadone as needed for anxiety/ to help with sleep.  Mom states she takes them once or twice a week.  - continue trazadone and klonopin PRN - continue depakote  PCOS - Stable.  Patient manages symptoms with OCPs. Discussed with mother that these predispose to blood clots and could potentially be underlying cause as history did not reveal any other provoking factors.  -Will hold OCP for now, day team to further discuss with mother risks vs benefits     FEN/GI: regular diet, SLIV Prophylaxis: xarelto   Disposition: admit to med-surg, attending Dr. Mingo Amber   History of Present Illness:  [name] is a 48 y.o. female presenting with left leg pain and swelling.    Mom noticed it first on Friday and tried to make an appointment in clinic for Monday but unable to schedule. Came in today and was sent for outpatient doppler which showed a DVT.  Mother was informed and asked to present to ED for likely admission.   No SOB or difficulty breathing. No CP.  No prior h/o DVT or PE.  No Fhx of clotting disorders.  She is on oral contraceptive pills for her PCOS.  No recent prolonged immobilization or flight/long car rides.  No h/o cancer or tobacco use.  Lives at home with mother is her guardian and manages her medications and medical care. Mother makes her health decisions for her.   Of note, mom has noticed a strong odor in her urine concerning for possible infection.  She was prescribed an antibiotic in clinic this morning for UTI however they have not been able to fill this yet as she was sent to  the ED for concern for clot. No fevers, chills, nausea or vomiting.   Review Of Systems: Per HPI with the following additions:   Review of Systems  Constitutional: Negative for chills and fever.  HENT: Negative for sore throat.   Respiratory: Negative for cough, sputum production and shortness of breath.   Cardiovascular: Positive for leg swelling. Negative for chest pain.  Gastrointestinal: Negative for abdominal pain, diarrhea, nausea and vomiting.  Genitourinary: Positive for dysuria.  Neurological: Negative for weakness and headaches.  Psychiatric/Behavioral: The patient is nervous/anxious.     Patient Active Problem List   Diagnosis Date Noted  . DVT (deep venous thrombosis) ([place]) 11/05/2018  . Renal insufficiency 02/22/2014  . Hair changes 06/13/2012  . Mood disorder ([place]) 01/11/2012  . Encounter for long-term (current) use of high-risk medication 11/30/2010  . PCOS (polycystic ovarian syndrome) 11/10/2008  . Down syndrome 11/10/2008  . Mixed hyperlipidemia 10/31/2006   Past Medical History: Past Medical History:  Diagnosis Date  . Down syndrome   . Hyperlipidemia   . Mood disorder ([place])   . Obesity   . PCOS (polycystic ovarian syndrome)    Past Surgical History: History reviewed. No pertinent surgical history.  Social History: Social History   Tobacco Use  . Smoking status: Never Smoker  . Smokeless tobacco: Never Used  Substance Use Topics  . Alcohol use: No  . Drug use: No   Additional social history: Lives at home with mother who is her guardian.  No tobacco, alcohol or illicit drug use.  Please also refer to relevant sections of EMR.  Family History: No significant fhx.   Allergies and Medications: No Known Allergies No current facility-administered medications on file prior to encounter.    Current Outpatient Medications on File Prior to Encounter  Medication Sig Dispense Refill  . Blood Glucose Monitoring Suppl (ACCU-CHEK AVIVA PLUS) w/Device  KIT use as directed 1 kit 0  . clonazePAM (KLONOPIN) 0.5 MG tablet Take 0.25 mg by mouth daily as needed for anxiety.    Marland Kitchen desogestrel-ethinyl estradiol (VIORELE) 0.15-0.02/0.01 MG (21/5) tablet Take 1 tablet by mouth daily. 84 tablet 2  . divalproex (DEPAKOTE ER) 500 MG 24 hr tablet Take 500 mg by mouth daily.     Marland Kitchen glucose blood (ACCU-CHEK AVIVA) test strip Use to check sugar daily as needed. 100 each 3  . traZODone (DESYREL) 50 MG tablet Take 20-50 mg by mouth at bedtime as needed for sleep. Take 1/2-1 tablest by mouth at bedtime as needed for insomnia    . cephALEXin (KEFLEX) 250 MG/5ML suspension Take 10 mLs (500 mg total) by mouth 2 (two) times daily. (Patient not taking: Reported on 11/05/2018) 140 mL 0  . [DISCONTINUED] desogestrel-ethinyl estradiol (MIRCETTE) 0.15-0.02/0.01 MG (21/5) per tablet Take 1 tablet by mouth daily.        Objective: BP 90/67   Pulse 83  Temp 98.4 F (36.9 C) (Oral)   Resp 16   SpO2 100%    Exam: General: alert.  Sitting up in bed.   Able to follow commands. Verbal at baseline.  Eyes: PERRL. No scleral icterus.  ENTM: moist oral mucosa.  No oropharyngeal erythema.  Neck: supple, no thyromegaly Cardiovascular: regular rhythm normal rate. No murmurs. 2+ radial pulse b/l.  Left leg edema going past knee. Left DP pulse appreciated with doppler  Respiratory: LCTAB.  No wheeze or crackles. No tachypnea.  Gastrointestinal: soft, nontender. Bowel sounds present.  MSK: asymmetry in diameter of legs.  Left leg larger in diameter with erythema to mid calf and tenderness.   Derm: no rashes.  Skin warm and dry. Feet cool to touch.   Neuro: Sensation intact.  Uanble to perform thorough neuro exam given mental status.  Psych: Patient has down syndrome.  Makes eye contact and can follow commands, but was non-verbal. Mom states she talks, but is nervous around providers.    Labs and Imaging: CBC BMET  Recent Labs  Lab 11/04/18 1836  WBC 10.2  HGB 13.1  HCT 41.9   PLT 297   Recent Labs  Lab 11/04/18 1836  NA 137  K 3.9  CL 106  CO2 20*  BUN 17  CREATININE 0.95  GLUCOSE 90  CALCIUM 8.8*     Benay Pike, MD  PGY-1, [facility] pager: [phone], text pages welcome  I have seen and evaluated the patient with Dr. Jeannine Kitten and agree with his documentation as above. I have included my edits in blue.  Lovenia Kim, MD 11/05/2018,7:50 AM [place],PGY-3

## 2018-11-06 LAB — URINE CULTURE: Culture: <10000 — AB

## 2018-11-07 ENCOUNTER — Other Ambulatory Visit: Payer: Self-pay

## 2018-11-07 ENCOUNTER — Telehealth: Payer: Self-pay

## 2018-11-07 ENCOUNTER — Encounter: Payer: Self-pay | Admitting: Family Medicine

## 2018-11-07 ENCOUNTER — Ambulatory Visit (INDEPENDENT_AMBULATORY_CARE_PROVIDER_SITE_OTHER): Payer: Medicare Other | Admitting: Family Medicine

## 2018-11-07 VITALS — BP 102/60 | HR 74 | Temp 98.0°F | Ht 59.0 in | Wt 141.0 lb

## 2018-11-07 DIAGNOSIS — Z309 Encounter for contraceptive management, unspecified: Secondary | ICD-10-CM | POA: Insufficient documentation

## 2018-11-07 DIAGNOSIS — I824Y2 Acute embolism and thrombosis of unspecified deep veins of left proximal lower extremity: Secondary | ICD-10-CM | POA: Diagnosis not present

## 2018-11-07 MED ORDER — RIVAROXABAN 20 MG PO TABS: 20.0000 mg | ORAL_TABLET | Freq: Every day | ORAL | 1 refills | Status: DC

## 2018-11-07 MED ORDER — NORETHINDRONE 0.35 MG PO TABS: 1.0000 | ORAL_TABLET | Freq: Every day | ORAL | 11 refills | Status: DC

## 2018-11-07 NOTE — Assessment & Plan Note (Signed)
Mom prefers patient stay on birth control. Not sexually active. Change birth control to progesterone only pills - norethindrone. Reviewed CDC Medical Eligibility Criteria, and POP is level 2 for acute DVT (benefits outweigh risks).

## 2018-11-07 NOTE — Telephone Encounter (Signed)
She can go ahead and start today, yes. Please let mom know. Thanks Latrelle Dodrill, MD

## 2018-11-07 NOTE — Telephone Encounter (Signed)
Mom called again. Informed to start today. [place], Maryjo Rochester, CMA

## 2018-11-07 NOTE — Patient Instructions (Addendum)
Sent in progesterone-only birth control.  Also sent in 2 more months of the xarelto.  Follow up in 2 months with Dr. Darin Engels, sooner if needed or if anything changes.  Be well, Dr. Pollie Meyer     Deep Vein Thrombosis  Deep vein thrombosis (DVT) is a condition in which a blood clot forms in a deep vein, such as a lower leg, thigh, or arm vein. A clot is blood that has thickened into a gel or solid. This condition is dangerous. It can lead to serious and even life-threatening complications if the clot travels to the lungs and causes a blockage (pulmonary embolism). It can also damage veins in the leg. This can result in leg pain, swelling, discoloration, and sores (post-thrombotic syndrome). What are the causes? This condition may be caused by:  A slowdown of blood flow.  Damage to a vein.  A condition that causes blood to clot more easily, such as an inherited clotting disorder. What increases the risk? The following factors may make you more likely to develop this condition:  Being overweight.  Being older, especially over age 67.  Sitting or lying down for more than four hours.  Being in the hospital.  Lack of physical activity (sedentary lifestyle).  Pregnancy, being in childbirth, or having recently given birth.  Taking medicines that contain estrogen, such as medicines to prevent pregnancy.  Smoking.  A history of any of the following: ? Blood clots or a blood clotting disease. ? Peripheral vascular disease. ? Inflammatory bowel disease. ? Cancer. ? Heart disease. ? Genetic conditions that affect how your blood clots, such as Factor V Leiden mutation. ? Neurological diseases that affect your legs (leg paresis). ? A recent injury, such as a car accident. ? Major or lengthy surgery. ? A central line placed inside a large vein. What are the signs or symptoms? Symptoms of this condition include:  Swelling, pain, or tenderness in an arm or leg.  Warmth, redness,  or discoloration in an arm or leg. If the clot is in your leg, symptoms may be more noticeable or worse when you stand or walk. Some people may not develop any symptoms. How is this diagnosed? This condition is diagnosed with:  A medical history and physical exam.  Tests, such as: ? Blood tests. These are done to check how well your blood clots. ? Ultrasound. This is done to check for clots. ? Venogram. For this test, contrast dye is injected into a vein and X-rays are taken to check for any clots. How is this treated? Treatment for this condition depends on:  The cause of your DVT.  Your risk for bleeding or developing more clots.  Any other medical conditions that you have. Treatment may include:  Taking a blood thinner (anticoagulant). This type of medicine prevents clots from forming. It may be taken by mouth, injected under the skin, or injected through an IV (catheter).  Injecting clot-dissolving medicines into the affected vein (catheter-directed thrombolysis).  Having surgery. Surgery may be done to: ? Remove the clot. ? Place a filter in a large vein to catch blood clots before they reach the lungs. Some treatments may be continued for up to six months. Follow these instructions at home: If you are taking blood thinners:  Take the medicine exactly as told by your health care provider. Some blood thinners need to be taken at the same time every day. Do not skip a dose.  Talk with your health care provider before you take  any medicines that contain aspirin or NSAIDs. These medicines increase your risk for dangerous bleeding.  Ask your health care provider about foods and drugs that could change the way the medicine works (may interact). Avoid those things if your health care provider tells you to do so.  Blood thinners can cause easy bruising and may make it difficult to stop bleeding. Because of this: ? Be very careful when using knives, scissors, or other sharp  objects. ? Use an electric razor instead of a blade. ? Avoid activities that could cause injury or bruising, and follow instructions about how to prevent falls.  Wear a medical alert bracelet or carry a card that lists what medicines you take. General instructions  Take over-the-counter and prescription medicines only as told by your health care provider.  Return to your normal activities as told by your health care provider. Ask your health care provider what activities are safe for you.  Wear compression stockings if recommended by your health care provider.  Keep all follow-up visits as told by your health care provider. This is important. How is this prevented? To lower your risk of developing this condition again:  For 30 or more minutes every day, do an activity that: ? Involves moving your arms and legs. ? Increases your heart rate.  When traveling for longer than four hours: ? Exercise your arms and legs every hour. ? Drink plenty of water. ? Avoid drinking alcohol.  Avoid sitting or lying for a long time without moving your legs.  If you have surgery or you are hospitalized, ask about ways to prevent blood clots. These may include taking frequent walks or using anticoagulants.  Stay at a healthy weight.  If you are a woman who is older than age 66, avoid unnecessary use of medicines that contain estrogen, such as some birth control pills.  Do not use any products that contain nicotine or tobacco, such as cigarettes and e-cigarettes. This is especially important if you take estrogen medicines. If you need help quitting, ask your health care provider. Contact a health care provider if:  You miss a dose of your blood thinner.  Your menstrual period is heavier than usual.  You have unusual bruising. Get help right away if:  You have: ? New or increased pain, swelling, or redness in an arm or leg. ? Numbness or tingling in an arm or leg. ? Shortness of  breath. ? Chest pain. ? A rapid or irregular heartbeat. ? A severe headache or confusion. ? A cut that will not stop bleeding.  There is blood in your vomit, stool, or urine.  You have a serious fall or accident, or you hit your head.  You feel light-headed or dizzy.  You cough up blood. These symptoms may represent a serious problem that is an emergency. Do not wait to see if the symptoms will go away. Get medical help right away. Call your local emergency services (911 in the [place] Do not drive yourself to the hospital. Summary  Deep vein thrombosis (DVT) is a condition in which a blood clot forms in a deep vein, such as a lower leg, thigh, or arm vein.  Symptoms can include swelling, warmth, pain, and redness in your leg or arm.  This condition may be treated with a blood thinner (anticoagulant medicine), medicine that is injected to dissolve blood clots,compression stockings, or surgery.  If you are prescribed blood thinners, take them exactly as told. This information is not intended to  replace advice given to you by your health care provider. Make sure you discuss any questions you have with your health care provider. Document Released: 08/20/2005 Document Revised: 01/18/2017 Document Reviewed: 01/18/2017 Elsevier Interactive Patient Education  2019 [employer].

## 2018-11-07 NOTE — Assessment & Plan Note (Signed)
Doing well on xarelto. Sent in additional 2 months worth of 20mg  pills so patient will have the complete supply for her 3 months. Since this was a provoked DVT (due to patient taking estrogen) will plan for 3 months of anticoagulation. She will follow up in 2 months with PCP Dr. Darin Engels to review how she is doing prior to stopping the anticoagulation.

## 2018-11-07 NOTE — Telephone Encounter (Signed)
Patient's mother called to ask when patient should start the progesterone only birth control pill. Does she start today?  Call back is [phone] or [phone]  Ples Specter, RN [facility] RN)

## 2018-11-07 NOTE — Progress Notes (Signed)
  HPI:  [name] presents for hospital follow up. Patient was hospitalized from 11/04/18 to 11/05/18 with acute DVT. Discharged on xarelto, and birth control was stopped.  History obtained from mother as patient is largely nonverbal in the visit. Patient has been taking xarelto twice daily and tolerating this well. No bleeding. Received starter pack for 4 weeks worth of medications at the hospital.  Mom requests new rx for birth control that contains just progesterone. Patient is not sexually active according to mom, has no opportunity to become sexually active.  ROS: See HPI.  PMFSH: history of PCOS, down syndrome, hyperlipidemia, mood disorder  PHYSICAL EXAM: BP 102/60   Pulse 74   Temp 98 F (36.7 C) (Oral)   Ht 4\' 11"  (1.499 m)   Wt 141 lb (64 kg)   SpO2 98%   BMI 28.48 kg/m  Gen: no acute distress, pleasant, cooperative HEENT: normocephalic, atraumatic  Heart: regular rate and rhythm, no murmur Lungs: clear to auscultation bilaterally, normal work of breathing  Neuro: grossly nonfocal Ext: continued swelling of entire LLE without erythema visualized. + palpable cord in groin area.  ASSESSMENT/PLAN:  Contraception management Mom prefers patient stay on birth control. Not sexually active. Change birth control to progesterone only pills - norethindrone. Reviewed CDC Medical Eligibility Criteria, and POP is level 2 for acute DVT (benefits outweigh risks).  DVT (deep venous thrombosis) (HCC) Doing well on xarelto. Sent in additional 2 months worth of 20mg  pills so patient will have the complete supply for her 3 months. Since this was a provoked DVT (due to patient taking estrogen) will plan for 3 months of anticoagulation. She will follow up in 2 months with PCP Dr. Darin Engels to review how she is doing prior to stopping the anticoagulation.  FOLLOW UP: Follow up in 2 months for DVT, sooner if needed.  [place] J. Pollie Meyer, MD [facility]

## 2018-12-18 DIAGNOSIS — F29 Unspecified psychosis not due to a substance or known physiological condition: Secondary | ICD-10-CM | POA: Diagnosis not present

## 2019-01-14 ENCOUNTER — Encounter: Payer: Self-pay | Admitting: Family Medicine

## 2019-01-14 ENCOUNTER — Ambulatory Visit (HOSPITAL_COMMUNITY)
Admission: RE | Admit: 2019-01-14 | Discharge: 2019-01-14 | Disposition: A | Discharge status: Home / Self Care | Payer: Medicare Other | Source: Ambulatory Visit | Attending: Family Medicine | Admitting: Family Medicine

## 2019-01-14 ENCOUNTER — Other Ambulatory Visit: Payer: Self-pay

## 2019-01-14 ENCOUNTER — Ambulatory Visit (INDEPENDENT_AMBULATORY_CARE_PROVIDER_SITE_OTHER): Payer: Medicare Other | Admitting: Family Medicine

## 2019-01-14 ENCOUNTER — Telehealth: Payer: Self-pay | Admitting: Family Medicine

## 2019-01-14 VITALS — BP 110/68 | Wt 133.0 lb

## 2019-01-14 DIAGNOSIS — I824Y2 Acute embolism and thrombosis of unspecified deep veins of left proximal lower extremity: Secondary | ICD-10-CM | POA: Diagnosis not present

## 2019-01-14 NOTE — Progress Notes (Signed)
Left lower ext. Venous duplex has been completed. Refer to [facility] under chart review to view preliminary results.   01/14/2019  11:11 AM Katherine Syme, Gerarda Gunther

## 2019-01-14 NOTE — Patient Instructions (Signed)
Venous Thromboembolism Prevention  Venous thromboembolism (VTE) is a condition in which a blood clot (thrombus) develops in the body. A thrombus usually occurs in a deep vein in the leg or the pelvis (DVT), but it can also occur in the arm. Sometimes, pieces of a thrombus can break off from its original place of development and travel through the bloodstream to other parts of the body. When that happens, the thrombus is called an embolus. An embolus that travels to one or both lungs is called a pulmonary embolism. An embolism can block the blood flow in the blood vessels of other organs as well.  VTE is a serious health condition that can cause disability or death. It is very important to get help right away and to not ignore symptoms.  How can a VTE be prevented?    · Exercise regularly. Take a brisk 30 minute walk every day. Staying active and moving around can help you to prevent blood clots.  · Avoid sitting or lying in bed for long periods of time without moving your legs. Change your position often, especially during long-distance travel (over 4 hours).  · If you are a woman who is over 35 years of age, avoid unnecessary use of medicines that contain estrogen. These include birth control pills and hormone replacement therapy.  · Do not smoke, especially if you take estrogen medicines. If you need help quitting, ask your health care provider.  · Eat plenty of fruits and vegetables. Ask your health care provider or dietitian if there are foods that you should avoid.  · Maintain a weight that is appropriate for your height. Ask your health care provider what weight is healthy for you.  · Wear loose-fitting clothing. Avoid constrictive or tight clothing around your legs or waist.  · Try not to bump or injure your legs. Avoid crossing your legs when you are sitting.  · Do not use pillows under your knees while lying down unless told by your health care provider.  · Wear support hose (compression stockings or TED  hose) as told by your health care provider. Compression stockings increase blood flow in your legs and can help prevent blood clots. Do not let them bunch up when you are wearing them.  How can I prevent VTE when I travel?  Long-distance travel (over 4 hours) can increase the risk of a VTE. To prevent VTE when traveling:  · Exercise your legs every hour by standing, stretching, and bending and straightening your legs. If you are traveling by airplane, train, or bus, walk up and down the aisle as often as possible to get your blood moving. If you are traveling by car, stop and get out of the car every hour to exercise your legs and stretch. Other types of exercise might include:  ? Keeping your feet flat on the ground and raising your toes.  ? Switching from tightening the muscles in your calves and thighs to relaxing those same muscles while you are sitting.  ? Pointing and flexing your feet at the ankle joints while you are sitting.  · Stay well hydrated while traveling. Drink enough water to keep your urine clear or pale yellow.  · Avoid drinking alcohol during long travel.  Generally, it is not recommended that you take medicines to prevent DVT during routine travel.  How can VTE be prevented if I am hospitalized?  A VTE may be prevented by taking medicines that are prescribed to prevent blood clots (anticoagulants). You   can also help to prevent VTE while in the hospital by taking these actions:  · Get out of bed and walk. Ask your health care provider if this is safe for you to do.  · Request the use of a sequential compression device (SCD). This is a machine that pumps air into compression sleeves that are wrapped around your legs.  · Request the use of compression stockings, which are tight, elastic stockings that apply pressure to the lower legs. Compression stockings are sometimes used with SCDs.  How can I prevent VTE after surgery?  Understand that there is an increased risk for VTE for the first 4-6 weeks  after surgery. During this time:  · Avoid long-distance travel (over 4 hours). If you must travel during this time, ask your health care provider about additional preventive actions that you can take. These might include exercising your arms and legs every hour while you travel.  · Avoid sitting or lying still for too long. If possible, get up and walk around one time every hour. Ask your health care provider when this is safe for you to do.  Get help right away if:  · You have new or increased pain, swelling, or redness in an arm or leg.  · You have numbness or tingling in an arm or leg.  · You have shortness of breath while active or at rest.  · You have chest pain.  · You have a rapid or irregular heartbeat.  · You feel light-headed or dizzy.  · You cough up blood.  · You notice blood in your vomit, bowel movement, or urine.  These symptoms may represent a serious problem that is an emergency. Do not wait to see if the symptoms will go away. Get medical help right away. Call your local emergency services (911 in the [place] Do not drive yourself to the hospital.  This information is not intended to replace advice given to you by your health care provider. Make sure you discuss any questions you have with your health care provider.  Document Released: 08/08/2009 Document Revised: 04/05/2017 Document Reviewed: 12/15/2014  Elsevier Interactive Patient Education © 2019 Elsevier Inc.

## 2019-01-14 NOTE — Progress Notes (Signed)
   Subjective:    Patient ID: [name], female    DOB: [birth year], 48 y.o.   MRN: [id number]   CC: LLE DVT f/u  HPI: DVT Patient presenting for follow-up of acute DVT.  Recently admitted in March and found to have DVT on left lower extremity that extended to the iliac veins.  Patient was started on Xarelto in the hospital and was instructed to continue for 77-month course (this was a provoked DVT as patient was on estrogen birth control).  Patient was recently seen on 11/07/2018 by Dr. Pollie Meyer who recommended continuing Xarelto for 18-month course and switched her contraception from estrogen to progesterone only pills.  Mother states overall patient has been very compliant and has been taking her pills every day.  States that she has not had any shortness of breath or dyspnea.  Denies any pain in her left calf but does report pain in left ankle.  Area of left ankle is also more red and warm.  Other parts of her leg are not swollen, red, or warm.  Patient has stayed active, non sedentary. Mother is worried that she may have another blood clot in her ankle.   Objective:  BP 110/68   Wt 133 lb (60.3 kg)   LMP 11/18/2018 (Approximate)   BMI 26.86 kg/m  Vitals and nursing note reviewed  General: well nourished, in no acute distress HEENT: normocephalic, no scleral icterus or conjunctival pallor, no nasal discharge, moist mucous membranes, good dentition without erythema or discharge noted in posterior oropharynx Cardiac: RRR, clear S1 and S2, no murmurs, rubs, or gallops Respiratory: clear to auscultation bilaterally, no increased work of breathing Abdomen: soft, nontender, nondistended, no masses or organomegaly. Bowel sounds present Extremities: no edema or cyanosis. Bilateral feet erythema, L>R. L foot slightly warmer than right. No edema in bilateral feet. Warm, well perfused. 2+ PT pulses bilaterally. Full ankle ROM.  Skin: warm and dry, no rashes noted Neuro: alert and oriented, no  focal deficits   Assessment & Plan:    DVT (deep venous thrombosis) (HCC) Compliant on Xarelto. Will complete course at the end of this month. Advised to continue to avoid estrogen contraception pills. Given new erythema and pain in LLE concern that patient's DVT has not fully resolved. Discussed this with Dr. Jamse Mead (preceptor). Will plan to get LLE Doppler US to r/u recurrent DVT. If not DVT is present, can stop anticoagulation at the end of the month (complete 3 month course). If recurrent DVT can consider changing to lovenox for anticoagulation. [place] scheduled for 5/13 @10AM , mother and patient informed prior to leaving clinic. Follow up if no improvement.     Return if symptoms worsen or fail to improve.   Oralia Manis, DO, PGY-2

## 2019-01-14 NOTE — Assessment & Plan Note (Signed)
Compliant on Xarelto. Will complete course at the end of this month. Advised to continue to avoid estrogen contraception pills. Given new erythema and pain in LLE concern that patient's DVT has not fully resolved. Discussed this with Dr. Jamse Mead (preceptor). Will plan to get LLE Doppler US to r/u recurrent DVT. If not DVT is present, can stop anticoagulation at the end of the month (complete 3 month course). If recurrent DVT can consider changing to lovenox for anticoagulation. [place] scheduled for 5/13 @10AM , mother and patient informed prior to leaving clinic. Follow up if no improvement.

## 2019-01-14 NOTE — Telephone Encounter (Signed)
Discussed results of DVT Doppler ultrasound with patient's mother in great detail.  Spent significant time discussing results as well as plan.  Given that no acute DVTs have been seen in chronic DVT looks to be improving patient can discontinue Xarelto after completing 69-month course.  Discussed this with Dr. Laureen Ochs (preceptor) to confirm plan.  Mother was very worried about stopping blood thinners.  Discussed risks and benefits with patient's mother.  Discussed in detail indications for longer course of anticoagulation.  Encouraged mother to call or send MyChart message with any questions as I am happy to answer them.  Advised strict return precautions.  Follow-up if no improvement.  Orpah Clinton, PGY-2 [facility] 01/14/2019 2:13 PM

## 2019-01-22 ENCOUNTER — Other Ambulatory Visit: Payer: Self-pay | Admitting: Family Medicine

## 2019-03-02 ENCOUNTER — Encounter: Payer: Medicare Other | Admitting: Family Medicine

## 2019-03-02 NOTE — Progress Notes (Signed)
Subjective:  CC -- Annual Physical; With complaints of follow up for DVT   Pt reports she has had improvement in swelling and color. Reports she has no concerns.   Cardiovascular: - Risk as of 03/03/2019 The 10-year ASCVD risk score Mikey Bussing DC Jr., et al., 2013) is: 1%   Values used to calculate the score:     Age: 48 years     Sex: Female     Is Non-Hispanic African American: Yes     Diabetic: No     Tobacco smoker: No     Systolic Blood Pressure: 093 mmHg     Is BP treated: No     HDL Cholesterol: 73 mg/dL     Total Cholesterol: 242 mg/dL - Dx Hypertension: no  - Dx Hyperlipidemia: yes   - Dx Obesity: no - Physical Activity: none, mother tries to encourage physical activity but patient is resistant  - Diabetes: no, recent random glucose wnl in 11/2018  Cancer: Colorectal >> Colonoscopy: no  Lung >> Tobacco Use: no  Breast >> Mammogram: no, discussed with mother. Would like to discuss in the future but not at this visit  Cervical/Endometrial >>  - Postmenopausal: no  - Vaginal Bleeding: yes - Pap Smear: no. Mother will schedule for the future  Skin >> Suspicious lesions: no  Social: Alcohol Use: no  Tobacco Use: no   - Interested in Quitting: N/a  Other Drugs: no  Risky Sexual Behavior: no  Support and Life at Home: yes   Other: Osteoporosis: no   Zoster Vaccine: no   Flu Vaccine: UTD Pneumonia Vaccine: no TDAP: due, patient is "in a bad mood" today and would like to defer to next visit    ROS-10 point ROS negative   Past Medical History Patient Active Problem List   Diagnosis Date Noted  . Contraception management 11/07/2018  . DVT (deep venous thrombosis) ([place]) 11/05/2018  . Renal insufficiency 02/22/2014  . Hair changes 06/13/2012  . Mood disorder ([place]) 01/11/2012  . Encounter for long-term (current) use of high-risk medication 11/30/2010  . PCOS (polycystic ovarian syndrome) 11/10/2008  . Down syndrome 11/10/2008  . Mixed hyperlipidemia 10/31/2006     Medications- reviewed and updated Current Outpatient Medications  Medication Sig Dispense Refill  . Blood Glucose Monitoring Suppl (ACCU-CHEK AVIVA PLUS) w/Device KIT use as directed 1 kit 0  . clonazePAM (KLONOPIN) 0.5 MG tablet Take 0.25 mg by mouth daily as needed for anxiety.    . divalproex (DEPAKOTE ER) 500 MG 24 hr tablet Take 500 mg by mouth daily.     Marland Kitchen glucose blood (ACCU-CHEK AVIVA) test strip Use to check sugar daily as needed. 100 each 3  . norethindrone (MICRONOR,CAMILA,ERRIN) 0.35 MG tablet Take 1 tablet (0.35 mg total) by mouth daily. 1 Package 11  . traZODone (DESYREL) 50 MG tablet Take 20-50 mg by mouth at bedtime as needed for sleep. Take 1/2-1 tablest by mouth at bedtime as needed for insomnia     No current facility-administered medications for this visit.     Objective: BP 122/80   Pulse 71   Wt 131 lb 3.2 oz (59.5 kg)   SpO2 90%   BMI 26.50 kg/m  Gen: NAD, alert, cooperative with exam HEENT: NCAT, EOMI, PERRL CV: RRR, good S1/S2, no murmur Resp: CTABL, no wheezes, non-labored Abd: Soft, Non Tender, Non Distended, BS present, no guarding or organomegaly Genital Exam: not done Ext: No edema, warm.  Negative Homans bilaterally Neuro: Alert and oriented, No  gross deficits   Assessment/Plan:  DVT (deep venous thrombosis) ([place]) Admitted physical exam today.  No swelling or erythema.  Negative Homans bilaterally.  Patient had a provoked DVT due to estrogen/progesterone birth control pills.  Patient was discontinued off of the estrogen portion of her birth control and completed 14-monthcourse of Xarelto.  No longer needs any further anticoagulation.  Encouraged daily exercise.  Follow-up as needed.  Mixed hyperlipidemia Will check fasting lipid panel.  Made for future labs [facility]  Down syndrome Mental health monitor by psychiatrist.  Depakote and Klonopin as well as trazodone as prescribed by psychiatrist.  Form to allow  caretaker completed for patient.   Orders Placed This Encounter  Procedures  . Lipid Panel    Standing Status:   Future    Standing Expiration Date:   03/02/2020    No orders of the defined types were placed in this encounter.    SCaroline More DO, PGY-2 03/03/2019 4:41 PM

## 2019-03-03 ENCOUNTER — Encounter: Payer: Self-pay | Admitting: Family Medicine

## 2019-03-03 ENCOUNTER — Encounter: Payer: Medicare Other | Admitting: Family Medicine

## 2019-03-03 ENCOUNTER — Other Ambulatory Visit: Payer: Self-pay

## 2019-03-03 ENCOUNTER — Ambulatory Visit (INDEPENDENT_AMBULATORY_CARE_PROVIDER_SITE_OTHER): Payer: Medicare Other | Admitting: Family Medicine

## 2019-03-03 VITALS — BP 122/80 | HR 71 | Wt 131.2 lb

## 2019-03-03 DIAGNOSIS — Q909 Down syndrome, unspecified: Secondary | ICD-10-CM | POA: Diagnosis not present

## 2019-03-03 DIAGNOSIS — I82402 Acute embolism and thrombosis of unspecified deep veins of left lower extremity: Secondary | ICD-10-CM | POA: Diagnosis not present

## 2019-03-03 DIAGNOSIS — E7849 Other hyperlipidemia: Secondary | ICD-10-CM | POA: Diagnosis not present

## 2019-03-03 DIAGNOSIS — E782 Mixed hyperlipidemia: Secondary | ICD-10-CM | POA: Diagnosis not present

## 2019-03-03 NOTE — Assessment & Plan Note (Signed)
Mental health monitor by psychiatrist.  Depakote and Klonopin as well as trazodone as prescribed by psychiatrist.  Form to allow caretaker completed for patient.

## 2019-03-03 NOTE — Assessment & Plan Note (Signed)
Admitted physical exam today.  No swelling or erythema.  Negative Homans bilaterally.  Patient had a provoked DVT due to estrogen/progesterone birth control pills.  Patient was discontinued off of the estrogen portion of her birth control and completed 10-month course of Xarelto.  No longer needs any further anticoagulation.  Encouraged daily exercise.  Follow-up as needed.

## 2019-03-03 NOTE — Assessment & Plan Note (Signed)
Will check fasting lipid panel.  Made for future labs as patient can only have lab draws at [facility].

## 2019-03-03 NOTE — Patient Instructions (Signed)
Please schedule your pap smear, tdap vaccine, and mammogram at your earliest convenience.   Thank you,  Dr. Tammi Klippel

## 2019-04-02 DIAGNOSIS — F29 Unspecified psychosis not due to a substance or known physiological condition: Secondary | ICD-10-CM | POA: Diagnosis not present

## 2019-09-03 DIAGNOSIS — F29 Unspecified psychosis not due to a substance or known physiological condition: Secondary | ICD-10-CM | POA: Diagnosis not present

## 2019-10-02 ENCOUNTER — Other Ambulatory Visit: Payer: Self-pay | Admitting: Family Medicine

## 2019-12-28 ENCOUNTER — Other Ambulatory Visit: Payer: Self-pay

## 2019-12-28 MED ORDER — NORETHINDRONE 0.35 MG PO TABS: ORAL_TABLET | ORAL | 2 refills | Status: DC

## 2020-02-11 DIAGNOSIS — F29 Unspecified psychosis not due to a substance or known physiological condition: Secondary | ICD-10-CM | POA: Diagnosis not present

## 2020-03-02 ENCOUNTER — Encounter: Payer: Self-pay | Admitting: Family Medicine

## 2020-03-02 ENCOUNTER — Other Ambulatory Visit (HOSPITAL_COMMUNITY)
Admission: AD | Admit: 2020-03-02 | Discharge: 2020-03-02 | Disposition: A | Discharge status: Home / Self Care | Payer: Medicare Other | Source: Ambulatory Visit | Attending: Family Medicine | Admitting: Family Medicine

## 2020-03-02 ENCOUNTER — Other Ambulatory Visit: Payer: Self-pay

## 2020-03-02 ENCOUNTER — Ambulatory Visit (INDEPENDENT_AMBULATORY_CARE_PROVIDER_SITE_OTHER): Payer: Medicare Other | Admitting: Family Medicine

## 2020-03-02 VITALS — BP 100/70 | HR 72 | Ht <= 58 in | Wt 123.6 lb

## 2020-03-02 DIAGNOSIS — E785 Hyperlipidemia, unspecified: Secondary | ICD-10-CM

## 2020-03-02 DIAGNOSIS — I82402 Acute embolism and thrombosis of unspecified deep veins of left lower extremity: Secondary | ICD-10-CM | POA: Diagnosis not present

## 2020-03-02 DIAGNOSIS — Z79899 Other long term (current) drug therapy: Secondary | ICD-10-CM | POA: Diagnosis not present

## 2020-03-02 DIAGNOSIS — E1169 Type 2 diabetes mellitus with other specified complication: Secondary | ICD-10-CM | POA: Diagnosis not present

## 2020-03-02 DIAGNOSIS — Z1159 Encounter for screening for other viral diseases: Secondary | ICD-10-CM | POA: Diagnosis not present

## 2020-03-02 DIAGNOSIS — Q909 Down syndrome, unspecified: Secondary | ICD-10-CM | POA: Diagnosis not present

## 2020-03-02 DIAGNOSIS — Z3041 Encounter for surveillance of contraceptive pills: Secondary | ICD-10-CM

## 2020-03-02 LAB — BILIRUBIN, DIRECT: Bilirubin, Direct: <0.1 mg/dL (ref 0.0–0.2)

## 2020-03-02 LAB — COMPREHENSIVE METABOLIC PANEL
ALT: 11 U/L (ref 0–44)
AST: 24 U/L (ref 15–41)
Albumin: 3.7 g/dL (ref 3.5–5.0)
Alkaline Phosphatase: 54 U/L (ref 38–126)
Anion gap: 12 (ref 5–15)
BUN: 16 mg/dL (ref 6–20)
CO2: 23 mmol/L (ref 22–32)
Calcium: 9.3 mg/dL (ref 8.9–10.3)
Chloride: 105 mmol/L (ref 98–111)
Creatinine, Ser: 1.08 mg/dL — ABNORMAL HIGH (ref 0.44–1.00)
GFR calc Af Amer: >60 mL/min (ref >60)
GFR calc non Af Amer: >60 mL/min (ref >60)
Glucose, Bld: 74 mg/dL (ref 70–99)
Potassium: 4.3 mmol/L (ref 3.5–5.1)
Sodium: 140 mmol/L (ref 135–145)
Total Bilirubin: 0.5 mg/dL (ref 0.3–1.2)
Total Protein: 7.7 g/dL (ref 6.5–8.1)

## 2020-03-02 LAB — LIPID PANEL
Cholesterol: 246 mg/dL — ABNORMAL HIGH (ref 0–200)
HDL: 57 mg/dL (ref >40)
LDL Cholesterol: 180 mg/dL — ABNORMAL HIGH (ref 0–99)
Total CHOL/HDL Ratio: 4.3 RATIO
Triglycerides: 44 mg/dL (ref <150)
VLDL: 9 mg/dL (ref 0–40)

## 2020-03-02 LAB — HEPATITIS C ANTIBODY: HCV Ab: NONREACTIVE

## 2020-03-02 LAB — VALPROIC ACID LEVEL: Valproic Acid Lvl: 49 ug/mL — ABNORMAL LOW (ref 50.0–100.0)

## 2020-03-02 LAB — AMMONIA: Ammonia: 38 umol/L — ABNORMAL HIGH (ref 9–35)

## 2020-03-02 LAB — POCT URINE PREGNANCY: Preg Test, Ur: NEGATIVE

## 2020-03-02 MED ORDER — NORETHINDRONE 0.35 MG PO TABS: ORAL_TABLET | ORAL | 2 refills | Status: DC

## 2020-03-02 NOTE — Assessment & Plan Note (Signed)
Caregiver paperwork completed for patient.  Mental health is monitored by psychiatrist.  Depakote and Klonopin as well as trazodone are prescribed by psychiatrist.  Psychiatrist has requested lab work including Depakote level, ammonia, CMP.  Will obtain these with lipid panel and A1c per mother's request.  Follow-up as needed with [facility].

## 2020-03-02 NOTE — Assessment & Plan Note (Signed)
Patient currently doing well on progesterone only contraception.  Discussed possibly weaning off as patient may be nearing menopause.  Mother did not have menopause still she was 55 and maternal grandmother also was in her 15s when they had menopause.  Mother would like to stay on birth control at this time.  Discussed risks and benefits.  Given that benefits still outweigh the risks of DVT we will continue POPs.  Should discuss with future PCP of discontinuing now that patient is nearing menopause.

## 2020-03-02 NOTE — Progress Notes (Signed)
    SUBJECTIVE:   CHIEF COMPLAINT / HPI:   Patient with history of provoked DVT while on estrogen containing OCPs. Discontinued off of Xarelto after 3 month course and put on progesterone only contraception.   Contraception  Patient's mother reports patient is doing well on progesterone contraception. Taking daily at the same time. Periods are monthly, last 2-3 days, light flow. Mother was 49 y.o when she had menopause. Maternal grandmother was in her 27s when she had menopause. Patient is not sexually active.   DVT Patient with history of provoked DVT. Mother would like her to be examined to make sure she does not have any further clots. No swelling or pain.   Caregiver paperwork Patient's mother needs a renewal of caregiver paperwork.   PERTINENT  PMH / PSH: DVT, PCOS, hyperlipidemia, down syndrome, mood disorder  OBJECTIVE:   BP 100/70   Pulse 72   Ht 4' 9.4" (1.458 m)   Wt 123 lb 9.6 oz (56.1 kg)   SpO2 98%   BMI 26.37 kg/m   Gen: awake and alert, NAD HEENT: normal conjunctiva Cardio: RRR, no MRG Resp: CTAB, no wheezes, rales or rhonchi Ext: no edema, calves equal, negative Homan's Skin: normal coloration, no rashes  ASSESSMENT/PLAN:   Contraception management Patient currently doing well on progesterone only contraception.  Discussed possibly weaning off as patient may be nearing menopause.  Mother did not have menopause still she was 50 and maternal grandmother also was in her 80s when they had menopause.  Mother would like to stay on birth control at this time.  Discussed risks and benefits.  Given that benefits still outweigh the risks of DVT we will continue POPs.  Should discuss with future PCP of discontinuing now that patient is nearing menopause.  DVT (deep venous thrombosis) (HCC) Normal physical exam today.  No swelling or erythema.  Negative Homans bilaterally.  Patient with previous history of provoked DVT while on combined oral contraceptive pills.   Currently doing well on progesterone only.  Has already completed 17-month course of Xarelto and does not need to continue this lifelong.  Encourage daily exercise and follow-up with future PCP.  Down syndrome Caregiver paperwork completed for patient.  Mental health is monitored by psychiatrist.  Depakote and Klonopin as well as trazodone are prescribed by psychiatrist.  Psychiatrist has requested lab work including Depakote level, ammonia, CMP.  Will obtain these with lipid panel and A1c per mother's request.  Follow-up as needed with [facility].     Oralia Manis, DO [facility]

## 2020-03-02 NOTE — Assessment & Plan Note (Signed)
Normal physical exam today.  No swelling or erythema.  Negative Homans bilaterally.  Patient with previous history of provoked DVT while on combined oral contraceptive pills.  Currently doing well on progesterone only.  Has already completed 37-month course of Xarelto and does not need to continue this lifelong.  Encourage daily exercise and follow-up with future PCP.

## 2020-03-02 NOTE — Patient Instructions (Signed)
Please follow up for your physical as soon as you can

## 2020-03-03 LAB — HEMOGLOBIN A1C
Hgb A1c MFr Bld: 8.1 % — ABNORMAL HIGH (ref 4.8–5.6)
Mean Plasma Glucose: 186 mg/dL

## 2020-03-08 ENCOUNTER — Telehealth: Payer: Self-pay

## 2020-03-08 NOTE — Telephone Encounter (Signed)
Patient's mother calls nurse line requesting copy of most recent lab results from visit on 6/30. Mother requesting them to be mailed to home address. Printed and placed in out-going mail bin.   Veronda Prude, RN

## 2020-05-27 ENCOUNTER — Other Ambulatory Visit: Payer: Self-pay

## 2020-05-27 MED ORDER — NORETHINDRONE 0.35 MG PO TABS: ORAL_TABLET | ORAL | 3 refills | Status: DC

## 2020-06-13 ENCOUNTER — Telehealth: Payer: Self-pay | Admitting: Family Medicine

## 2020-06-13 NOTE — Telephone Encounter (Signed)
Professional verification   form dropped off for at front desk for completion.  Verified that patient section of form has been completed.  Last DOS/WCC with PCP was06/30/21.  Placed form in team folder to be completed by clinical staff.  Grayce Fredda Hammed

## 2020-06-14 NOTE — Telephone Encounter (Signed)
Reviewed form and placed in PCP's box for completion.  .Shereta Crothers R Katarzyna Wolven, CMA  

## 2020-06-22 NOTE — Telephone Encounter (Signed)
Will attempt to complete tomorrow afternoon.   Lavonda Jumbo, DO 06/22/2020, 2:15 PM PGY-2, [facility]

## 2020-06-22 NOTE — Telephone Encounter (Signed)
Patient's mother calls nurse line requesting status of form completion.   Form is in provider box.   To PCP  Veronda Prude, RN

## 2020-06-23 NOTE — Telephone Encounter (Signed)
Form is completed and placed in RN triage box

## 2020-06-24 NOTE — Telephone Encounter (Signed)
Patient's mother called and informed that forms are ready for pick up. Copy made and placed in batch scanning. Original placed at front desk for pick up.  ° °Charmaine Placido C Lawrance Wiedemann, RN ° ° °

## 2020-07-02 ENCOUNTER — Ambulatory Visit: Payer: Medicare Other | Attending: Internal Medicine

## 2020-07-02 DIAGNOSIS — Z23 Encounter for immunization: Secondary | ICD-10-CM

## 2020-07-02 NOTE — Progress Notes (Signed)
   Covid-19 Vaccination Clinic  Name:  [name]    MRN: [id number] DOB: [birth year]  07/02/2020  [name] was observed post Covid-19 immunization for 15 minutes without incident. She was provided with Vaccine Information Sheet and instruction to access the V-Safe system.   [name] was instructed to call 911 with any severe reactions post vaccine: Marland Kitchen Difficulty breathing  . Swelling of face and throat  . A fast heartbeat  . A bad rash all over body  . Dizziness and weakness

## 2021-02-26 NOTE — Progress Notes (Signed)
    SUBJECTIVE:   Chief compliant/HPI: annual examination  [name] is a 50 y.o. who presents today for an annual exam.   Mood disorder/Psychosis Down Syndrome Connected with psychology prior but needs a new referral. Mother discontinued depakote due to increased mood issues. Continuing to take trazodone and klonopin. She is fearful of needles and procedures. Afraid she will get a shot today.   Contraception/PCOS Taking birth control pills for PCOS and was going to a daycare center prior to the pandemic and has some concern about possible sexual activity. Is not currently going to daycare center. Continues to have some hirsutism. Occasionally spotting with periods. Normal period 2 years prior. Hx of DVT while on OCPs. Mother states Dr. Darin Engels discussed discontinuing at last visit.   Caregiver paperwork Needs renewal of caregiver paperwork signed today.  OBJECTIVE:   BP 104/60   Pulse 72   Ht 4\' 9"  (1.448 m)   Wt 119 lb 12.8 oz (54.3 kg)   LMP 02/02/2021   SpO2 98%   BMI 25.92 kg/m   General: Appears well, no acute distress. Fearful of provider and potential needle; crossed arm and distant demeanor. Does not want to make eye contact. Age appropriate. Cardiac: RRR, normal heart sounds, no murmurs Respiratory: CTAB, normal effort Abdomen: exam deferred due to lack of cooperation. Extremities: No edema or cyanosis. Skin: Warm and dry, no rashes noted Neuro: alert. Occasional dystonia and grimacing. Psych: normal affect. Skeptic of provider's presence in the room.   ASSESSMENT/PLAN:   Mixed hyperlipidemia 03/02/2020 ASCVD risk 1.7% LDL 180. Not on statin medication. -Retrieve lipid panel -Consider starting statin medication  Elevated hemoglobin A1c measurement 03/02/20 Hgb A1c 8.1. Normal years prior. No follow up.  -Obtain Hgb A1c  -Consider diabetic regimen  Encounter for long-term (current) use of high-risk medication Recently discontinued depakote by mother due  to mood swing side effects. -Obtain CMP with shared decision making and mothers request  Mood disorder [facility]) Psychologist recently out on emergency leave unsure of return. Mother would like referral for different psychologist. Continued on klonopin and trazodone.  -Referral to pyschology  Contraception management -Discontinue OCPs (shared decision making with mother and hx of DVT; risk benefit discussion)   Annual Examination  See AVS for future recommendations  BP reviewed and at goal.  Considered the following items based upon USPSTF recommendations: Diabetes screening: discussed and ordered Screening for elevated cholesterol: discussed and ordered Cervical cancer screening:  Discussed and declined due to patient's lack of cooperation and hx of down syndrome.  Breast cancer screening:  Discussed and declined for the reasons above.  Colorectal cancer screening:  discussed and declined for the reasons above.  Vaccinations discussed Tdap vaccine today and declined for the reasons above; revisit other vaccines at subsequent visits.   Follow up in 1 year or sooner if indicated.    [facility], DO [facility]

## 2021-02-26 NOTE — Patient Instructions (Addendum)
Go to hospital labs to get blood draw. I will call you with results.   Please be sure to schedule follow up at the front  desk before you leave today.   If you haven't already, sign up for My Chart to have easy access to your labs results, and communication with your primary care physician.  Please call the clinic at [phone] if you have any concerns. It was our pleasure to serve you.  Dr. Salvadore Dom

## 2021-02-27 ENCOUNTER — Other Ambulatory Visit: Payer: Self-pay

## 2021-02-27 ENCOUNTER — Encounter: Payer: Self-pay | Admitting: Family Medicine

## 2021-02-27 ENCOUNTER — Ambulatory Visit (INDEPENDENT_AMBULATORY_CARE_PROVIDER_SITE_OTHER): Payer: Medicare Other | Admitting: Family Medicine

## 2021-02-27 VITALS — BP 104/60 | HR 72 | Ht <= 58 in | Wt 119.8 lb

## 2021-02-27 DIAGNOSIS — Z0000 Encounter for general adult medical examination without abnormal findings: Secondary | ICD-10-CM | POA: Diagnosis not present

## 2021-02-27 DIAGNOSIS — R7989 Other specified abnormal findings of blood chemistry: Secondary | ICD-10-CM

## 2021-02-27 DIAGNOSIS — Z309 Encounter for contraceptive management, unspecified: Secondary | ICD-10-CM | POA: Diagnosis not present

## 2021-02-27 DIAGNOSIS — F39 Unspecified mood [affective] disorder: Secondary | ICD-10-CM | POA: Diagnosis not present

## 2021-02-27 DIAGNOSIS — Z79899 Other long term (current) drug therapy: Secondary | ICD-10-CM

## 2021-02-27 DIAGNOSIS — E782 Mixed hyperlipidemia: Secondary | ICD-10-CM | POA: Diagnosis not present

## 2021-02-27 DIAGNOSIS — R7309 Other abnormal glucose: Secondary | ICD-10-CM

## 2021-02-28 ENCOUNTER — Telehealth: Payer: Self-pay

## 2021-02-28 ENCOUNTER — Other Ambulatory Visit (HOSPITAL_COMMUNITY)
Admission: AD | Admit: 2021-02-28 | Discharge: 2021-02-28 | Disposition: A | Discharge status: Home / Self Care | Payer: Medicare Other | Source: Ambulatory Visit | Attending: Family Medicine | Admitting: Family Medicine

## 2021-02-28 DIAGNOSIS — R7309 Other abnormal glucose: Secondary | ICD-10-CM | POA: Insufficient documentation

## 2021-02-28 NOTE — Telephone Encounter (Signed)
Patient's mother calls nurse line requesting orders for lab work. Per OV note from yesterday, patient was instructed to go to [facility] for lab work. I am unable to find future orders in chart review. Please advise if orders can be placed. Patient is waiting at the hospital now.   Provider paged at [phone]. Will await response.   Veronda Prude, RN

## 2021-02-28 NOTE — Assessment & Plan Note (Signed)
Psychologist recently out on emergency leave unsure of return. Mother would like referral for different psychologist. Continued on klonopin and trazodone.  -Referral to pyschology

## 2021-02-28 NOTE — Assessment & Plan Note (Signed)
03/02/20 Hgb A1c 8.1. Normal years prior. No follow up.  -Obtain Hgb A1c  -Consider diabetic regimen

## 2021-02-28 NOTE — Assessment & Plan Note (Signed)
03/02/2020 ASCVD risk 1.7% LDL 180. Not on statin medication. -Retrieve lipid panel -Consider starting statin medication

## 2021-02-28 NOTE — Assessment & Plan Note (Signed)
-  Discontinue OCPs (shared decision making with mother and hx of DVT; risk benefit discussion)

## 2021-02-28 NOTE — Assessment & Plan Note (Addendum)
Recently discontinued depakote by mother due to mood swing side effects. -Obtain CMP with shared decision making and mothers request

## 2021-03-02 NOTE — Telephone Encounter (Signed)
Called [facility] yesterday AM regarding lab orders. Technician reported that they were unable to access Epic to retrieve orders. Orders faxed to provided number at [phone]. Attempted to call mother, yesterday afternoon. No answer.   Returned call to mother this AM, informing that orders had been faxed to [facility]. Mother verbalizes understanding and is appreciative.   Veronda Prude, RN

## 2021-03-28 ENCOUNTER — Other Ambulatory Visit (HOSPITAL_COMMUNITY)
Admission: AD | Admit: 2021-03-28 | Discharge: 2021-03-28 | Disposition: A | Discharge status: Home / Self Care | Payer: Medicare Other | Source: Ambulatory Visit | Attending: Family Medicine | Admitting: Family Medicine

## 2021-03-28 DIAGNOSIS — Z79899 Other long term (current) drug therapy: Secondary | ICD-10-CM | POA: Diagnosis not present

## 2021-03-28 DIAGNOSIS — E785 Hyperlipidemia, unspecified: Secondary | ICD-10-CM | POA: Insufficient documentation

## 2021-03-28 LAB — COMPREHENSIVE METABOLIC PANEL
ALT: 18 U/L (ref 0–44)
AST: 27 U/L (ref 15–41)
Albumin: 3.9 g/dL (ref 3.5–5.0)
Alkaline Phosphatase: 58 U/L (ref 38–126)
Anion gap: 8 (ref 5–15)
BUN: 11 mg/dL (ref 6–20)
CO2: 26 mmol/L (ref 22–32)
Calcium: 9.4 mg/dL (ref 8.9–10.3)
Chloride: 106 mmol/L (ref 98–111)
Creatinine, Ser: 1.13 mg/dL — ABNORMAL HIGH (ref 0.44–1.00)
GFR, Estimated: 59 mL/min — ABNORMAL LOW (ref >60)
Glucose, Bld: 85 mg/dL (ref 70–99)
Potassium: 3.7 mmol/L (ref 3.5–5.1)
Sodium: 140 mmol/L (ref 135–145)
Total Bilirubin: 0.7 mg/dL (ref 0.3–1.2)
Total Protein: 7.4 g/dL (ref 6.5–8.1)

## 2021-03-28 LAB — LIPID PANEL
Cholesterol: 249 mg/dL — ABNORMAL HIGH (ref 0–200)
HDL: 74 mg/dL (ref >40)
LDL Cholesterol: 165 mg/dL — ABNORMAL HIGH (ref 0–99)
Total CHOL/HDL Ratio: 3.4 RATIO
Triglycerides: 50 mg/dL (ref <150)
VLDL: 10 mg/dL (ref 0–40)

## 2021-03-29 LAB — HEMOGLOBIN A1C
Hgb A1c MFr Bld: 5.5 % (ref 4.8–5.6)
Mean Plasma Glucose: 111 mg/dL

## 2021-06-15 ENCOUNTER — Telehealth: Payer: Self-pay

## 2021-06-15 NOTE — Telephone Encounter (Signed)
Patient's mother calls nurse line requesting to speak with provider regarding results from 03/28/2021. Mother also reports concerns for weight loss.   I have scheduled patient an appointment with PCP on 07/10/21. This was the next available appointment.   Mother is requesting returned call prior to appointment to discuss lab results. Please advise.   Veronda Prude, RN

## 2021-06-22 ENCOUNTER — Other Ambulatory Visit: Payer: Self-pay | Admitting: Family Medicine

## 2021-06-22 NOTE — Telephone Encounter (Signed)
Spoke with [name] about [name]'s labs. Hgb was very good new to her! She is concerned about weight loss. Plan to discuss this at follow up.   Lavonda Jumbo, DO 06/22/2021, 7:55 AM PGY-3, [facility]

## 2021-07-10 ENCOUNTER — Ambulatory Visit: Payer: Medicare Other | Admitting: Family Medicine

## 2021-11-07 ENCOUNTER — Other Ambulatory Visit (HOSPITAL_COMMUNITY)
Admission: RE | Admit: 2021-11-07 | Discharge: 2021-11-07 | Disposition: A | Discharge status: Home / Self Care | Payer: Medicare Other | Source: Other Acute Inpatient Hospital | Attending: Family Medicine | Admitting: Family Medicine

## 2021-11-07 ENCOUNTER — Ambulatory Visit: Payer: Medicare Other

## 2021-11-07 ENCOUNTER — Ambulatory Visit (INDEPENDENT_AMBULATORY_CARE_PROVIDER_SITE_OTHER): Payer: Medicare Other | Admitting: Family Medicine

## 2021-11-07 ENCOUNTER — Other Ambulatory Visit: Payer: Self-pay

## 2021-11-07 VITALS — BP 109/48 | HR 63 | Ht <= 58 in | Wt 93.2 lb

## 2021-11-07 DIAGNOSIS — Z1231 Encounter for screening mammogram for malignant neoplasm of breast: Secondary | ICD-10-CM

## 2021-11-07 DIAGNOSIS — E1169 Type 2 diabetes mellitus with other specified complication: Secondary | ICD-10-CM

## 2021-11-07 DIAGNOSIS — E785 Hyperlipidemia, unspecified: Secondary | ICD-10-CM

## 2021-11-07 DIAGNOSIS — R634 Abnormal weight loss: Secondary | ICD-10-CM | POA: Insufficient documentation

## 2021-11-07 DIAGNOSIS — Z114 Encounter for screening for human immunodeficiency virus [HIV]: Secondary | ICD-10-CM | POA: Diagnosis not present

## 2021-11-07 DIAGNOSIS — Z01812 Encounter for preprocedural laboratory examination: Secondary | ICD-10-CM | POA: Diagnosis not present

## 2021-11-07 DIAGNOSIS — R7309 Other abnormal glucose: Secondary | ICD-10-CM

## 2021-11-07 DIAGNOSIS — Z124 Encounter for screening for malignant neoplasm of cervix: Secondary | ICD-10-CM

## 2021-11-07 DIAGNOSIS — Z1211 Encounter for screening for malignant neoplasm of colon: Secondary | ICD-10-CM

## 2021-11-07 DIAGNOSIS — E782 Mixed hyperlipidemia: Secondary | ICD-10-CM

## 2021-11-07 DIAGNOSIS — Q909 Down syndrome, unspecified: Secondary | ICD-10-CM | POA: Insufficient documentation

## 2021-11-07 LAB — SEDIMENTATION RATE: Sed Rate: 10 mm/hr (ref 0–22)

## 2021-11-07 LAB — COMPREHENSIVE METABOLIC PANEL
ALT: 16 U/L (ref 0–44)
AST: 28 U/L (ref 15–41)
Albumin: 4.4 g/dL (ref 3.5–5.0)
Alkaline Phosphatase: 57 U/L (ref 38–126)
Anion gap: 13 (ref 5–15)
BUN: 17 mg/dL (ref 6–20)
CO2: 24 mmol/L (ref 22–32)
Calcium: 9.8 mg/dL (ref 8.9–10.3)
Chloride: 104 mmol/L (ref 98–111)
Creatinine, Ser: 0.9 mg/dL (ref 0.44–1.00)
GFR, Estimated: >60 mL/min (ref >60)
Glucose, Bld: 81 mg/dL (ref 70–99)
Potassium: 3.9 mmol/L (ref 3.5–5.1)
Sodium: 141 mmol/L (ref 135–145)
Total Bilirubin: 0.8 mg/dL (ref 0.3–1.2)
Total Protein: 8.2 g/dL — ABNORMAL HIGH (ref 6.5–8.1)

## 2021-11-07 LAB — POCT URINALYSIS DIP (MANUAL ENTRY)
Blood, UA: NEGATIVE
Glucose, UA: NEGATIVE mg/dL
Nitrite, UA: NEGATIVE
Protein Ur, POC: NEGATIVE mg/dL
Spec Grav, UA: >=1.03 — AB (ref 1.010–1.025)
Urobilinogen, UA: 0.2 E.U./dL
pH, UA: 5 (ref 5.0–8.0)

## 2021-11-07 LAB — POCT UA - MICROSCOPIC ONLY

## 2021-11-07 LAB — CBC
HCT: 45.8 % (ref 36.0–46.0)
Hemoglobin: 15.7 g/dL — ABNORMAL HIGH (ref 12.0–15.0)
MCH: 35.5 pg — ABNORMAL HIGH (ref 26.0–34.0)
MCHC: 34.3 g/dL (ref 30.0–36.0)
MCV: 103.6 fL — ABNORMAL HIGH (ref 80.0–100.0)
Platelets: 249 10*3/uL (ref 150–400)
RBC: 4.42 MIL/uL (ref 3.87–5.11)
RDW: 13.4 % (ref 11.5–15.5)
WBC: 5.9 10*3/uL (ref 4.0–10.5)
nRBC: 0 % (ref 0.0–0.2)

## 2021-11-07 LAB — BETA-HYDROXYBUTYRIC ACID: Beta-Hydroxybutyric Acid: 0.73 mmol/L — ABNORMAL HIGH (ref 0.05–0.27)

## 2021-11-07 LAB — POCT URINE PREGNANCY: Preg Test, Ur: NEGATIVE

## 2021-11-07 LAB — POCT GLYCOSYLATED HEMOGLOBIN (HGB A1C): Hemoglobin A1C: 5 % (ref 4.0–5.6)

## 2021-11-07 LAB — C-REACTIVE PROTEIN: CRP: <0.5 mg/dL (ref <1.0)

## 2021-11-07 LAB — POCT CBG (FASTING - GLUCOSE)-MANUAL ENTRY: Glucose Fasting, POC: 57 mg/dL — AB (ref 70–99)

## 2021-11-07 LAB — TSH: TSH: 1.529 u[IU]/mL (ref 0.350–4.500)

## 2021-11-07 LAB — HIV ANTIBODY (ROUTINE TESTING W REFLEX): HIV Screen 4th Generation wRfx: NONREACTIVE

## 2021-11-07 NOTE — Assessment & Plan Note (Addendum)
Chronic, but worsening in last month or so. No easily identifiable etiology. Differential is broad at this point but includes DKA, hyperthyroidism, malignancy, and infection. Ordered CBC, CMP, TSH, BHB, CRP, A1c, Sed rate, HIV, RPR, UA, urine micro, urine pregnancy, urine microalbumin. POC glucose 56, hence DKA ruled out. Urine pregnancy negative. UA consistent with dehydration. Will await other labs. Refer to GI for colonoscopy and GYN for PAP with anxiolytic. Ordered mammogram. Return precautions given. Close follow up scheduled for 2 weeks. See AVS for more.  ?

## 2021-11-07 NOTE — Patient Instructions (Signed)
It was wonderful to meet you today. Thank you for allowing me to be a part of your care. Below is a short summary of what we discussed at your visit today: ? ?Weight loss ?Today we took blood and urine samples. If the results are normal, I will send you a letter or MyChart message. If the results are abnormal, I will give you a call.   ? ?Come back for follow up in 2 weeks. Scheduled for afternoon of Tuesday 11/21/2021.  ? ?Colonoscopy ?I have referred you to GI for a colonoscopy to screen for colon cancer. Someone from their office should be calling you in 1 to 2 weeks to schedule an appointment.  If you do not hear from them, let us know. We may need to nudge along the referral.   ? ?Gynecology for PAP smear ?I have referred you to gynecology for PAP smear.  Someone from their office should be calling you in 1 to 2 weeks to schedule an appointment.  If you do not hear from them, let us know. We may need to nudge along the referral.   ? ?Mammogram ?I have ordered your routine mammogram to screen for breast cancer. This will be at the [facility]. You will call them directly to make an appointment at your convenience. Information below. ? ?  ? ? ? ?Please bring all of your medications to every appointment! ? ?If you have any questions or concerns, please do not hesitate to contact us via phone or MyChart message.  ? ?Fayette Pho, MD  ?

## 2021-11-07 NOTE — Assessment & Plan Note (Addendum)
Fasting lipid panel collected per mom's request with rest of blood draw. Will follow results.  ?

## 2021-11-07 NOTE — Progress Notes (Signed)
? ? ?SUBJECTIVE:  ? ?CHIEF COMPLAINT / HPI:  ? ?Unintentional weight loss ?- has been losing weight since 2021, mom believes the weight loss is becoming more rapid in the last couple of months ?- baseline weight 133-143 lbs. from 2017 to 2020 ?- 123 lbs 03/02/2020 > 119 lbs 02/27/2021 > 93 lbs today ?- mom had changed diet because of new T2DM diagnosis (A1c 8.1 on 03/02/2020, 5.5 on 03/28/2021) ? - mom had reduced fast food, excess sugars, cookies ? - still getting protein and "good carbs" at every meal ?- still eating, not as much of an appetite, has to be reminded to eat (last 2-3 weeks) which is a big difference for her ?- no abdominal pain, nausea, blood in stool or urine, heat/cold intolerance, brittle hair or finger nails, exophthalmos, polyuria, polydipsia ?- no changes in mood or how she's acting per mom ?- mom says her breath smells different 2-3 weeks ago, sweet smell ?- no health changes or new diagnoses ?- Fam Hist: no history of thyroid disorders, calcium or PTH d/o, history colon/breast/cervical cancer ?- has not had mammogram, colonoscopy, or pap smear (previously attempted PAP x2, unsuccessful) ? ?PERTINENT  PMH / PSH:  ?Patient Active Problem List  ? Diagnosis Date Noted  ? Unintentional weight loss 11/07/2021  ? Elevated hemoglobin A1c measurement 02/28/2021  ? Contraception management 11/07/2018  ? DVT (deep venous thrombosis) (HCC) 11/05/2018  ? Renal insufficiency 02/22/2014  ? Hair changes 06/13/2012  ? Mood disorder (HCC) 01/11/2012  ? Encounter for long-term (current) use of high-risk medication 11/30/2010  ? PCOS (polycystic ovarian syndrome) 11/10/2008  ? Down syndrome 11/10/2008  ? Mixed hyperlipidemia 10/31/2006  ?  ?OBJECTIVE:  ? ?BP (!) 109/48   Pulse 63   Ht 4\' 9"  (1.448 m)   Wt 93 lb 3.2 oz (42.3 kg)   SpO2 99%   BMI 20.17 kg/m?   ? ?PHQ-9:  ?Depression screen [facility] 2/9 11/07/2021 02/27/2021 03/03/2019  ?Decreased Interest 0 0 0  ?Down, Depressed, Hopeless 0 0 0  ?PHQ - 2 Score 0 0 0   ?Altered sleeping 3 0 -  ?Tired, decreased energy 0 0 -  ?Change in appetite 2 0 -  ?Feeling bad or failure about yourself  0 0 -  ?Trouble concentrating 0 0 -  ?Moving slowly or fidgety/restless 0 0 -  ?Suicidal thoughts 0 0 -  ?PHQ-9 Score 5 0 -  ?Difficult doing work/chores Somewhat difficult Not difficult at all -  ?  ?GAD-7: No flowsheet data found. ? ?Physical Exam ?General: Awake, alert, thin, frail in appearance ?HEENT: PERRL, bilateral TM pearly pink and flat, bilateral external auditory canals with minimal cerumen burden, no lesions, nasal mucosa slightly edematous, oral mucosa pink, moist, without lesion, intact dentition without obvious cavity ?Lymph: No palpable lymphedema of head or neck ?Cardiovascular: Regular rate and rhythm, S1 and S2 present, no murmurs auscultated ?Respiratory: Lung fields clear to auscultation bilaterally ?Abdomen: Soft, nondistended, no TTP in any quadrant, no rebound tenderness or guarding ?Extremities: No bilateral lower extremity edema, palpable pedal and pretibial pulses bilaterally ? ?ASSESSMENT/PLAN:  ? ?Unintentional weight loss ?Chronic, but worsening in last month or so. No easily identifiable etiology. Differential is broad at this point but includes DKA, hyperthyroidism, malignancy, and infection. Ordered CBC, CMP, TSH, BHB, CRP, A1c, Sed rate, HIV, RPR, UA, urine micro, urine pregnancy, urine microalbumin. POC glucose 56, hence DKA ruled out. Urine pregnancy negative. UA consistent with dehydration. Will await other labs. Refer to GI for  colonoscopy and GYN for PAP with anxiolytic. Ordered mammogram. Return precautions given. Close follow up scheduled for 2 weeks. See AVS for more.  ? ?Mixed hyperlipidemia ?Fasting lipid panel collected per mom's request with rest of blood draw. Will follow results.  ?  ? ? ?Fayette Pho, MD ?[facility]  ?

## 2021-11-08 LAB — C-REACTIVE PROTEIN

## 2021-11-08 LAB — SEDIMENTATION RATE

## 2021-11-08 LAB — TSH

## 2021-11-08 LAB — COMPREHENSIVE METABOLIC PANEL

## 2021-11-08 LAB — RPR: RPR Ser Ql: NONREACTIVE

## 2021-11-08 LAB — MICROALBUMIN / CREATININE URINE RATIO
Creatinine, Urine: 175.9 mg/dL
Microalb/Creat Ratio: 8 mg/g creat (ref 0–29)
Microalbumin, Urine: 13.5 ug/mL

## 2021-11-08 LAB — BETA-HYDROXYBUTYRIC ACID

## 2021-11-08 LAB — CBC WITH DIFFERENTIAL/PLATELET

## 2021-11-08 LAB — HIV ANTIBODY (ROUTINE TESTING W REFLEX)

## 2021-11-21 ENCOUNTER — Other Ambulatory Visit: Payer: Self-pay

## 2021-11-21 ENCOUNTER — Encounter: Payer: Self-pay | Admitting: Family Medicine

## 2021-11-21 ENCOUNTER — Ambulatory Visit (INDEPENDENT_AMBULATORY_CARE_PROVIDER_SITE_OTHER): Payer: Medicare Other | Admitting: Family Medicine

## 2021-11-21 VITALS — BP 102/65 | Temp 98.6°F | Ht 59.0 in | Wt 93.4 lb

## 2021-11-21 DIAGNOSIS — R634 Abnormal weight loss: Secondary | ICD-10-CM

## 2021-11-21 MED ORDER — TRAZODONE HCL 50 MG PO TABS: ORAL_TABLET | ORAL | 0 refills | Status: DC

## 2021-11-21 MED ORDER — CLONAZEPAM 0.5 MG PO TABS: 0.2500 mg | ORAL_TABLET | Freq: Every day | ORAL | 0 refills | Status: DC | PRN

## 2021-11-21 NOTE — Patient Instructions (Addendum)
It was wonderful to see you today. ? ?Please bring ALL of your medications with you to every visit.  ? ?Today we talked about: ? ?Introducing supplement shakes with meals. ? ?Adding a multivitamin. ? ?Following up in 1 month. ? ?Remember to get colonoscopy, mammogram, Pap smear. ? ?Please be sure to schedule follow up at the front  desk before you leave today.  ? ?If you haven't already, sign up for My Chart to have easy access to your labs results, and communication with your primary care physician. ? ?Please call the clinic at [phone] if your symptoms worsen or you have any concerns. It was our pleasure to serve you. ? ?Dr. Janus Molder ? ?

## 2021-11-21 NOTE — Progress Notes (Signed)
? ? ?  SUBJECTIVE:  ? ?CHIEF COMPLAINT / HPI:  ? ?Follow up, unintentional weight loss ?[name] is accompanied by her mother.  ?No new concerns today. Would like to discuss labs and desires a copy. Is planning to schedule mammogram, pap smear, and colonoscopy soon. Admits to decreasing the amount of sugar in her diet when her A1c was elevated. Also admits that [name]'s appetite has seemed to decrease in the amount that she eats at one sitting. She is offered 3 meals and usually eats what she wants and leaves the table. Her mother states she feels as if the portions of food have been the same. Denies fever, body aches. [name] states she is doing ok.  ? ?PERTINENT  PMH / PSH: Down syndrome ? ?OBJECTIVE:  ? ?BP 102/65   Temp 98.6 ?F (37 ?C)   Ht 4\' 11"  (1.499 m)   Wt 93 lb 6.4 oz (42.4 kg)   LMP 10/23/2021 (Approximate)   BMI 18.86 kg/m?   ?Physical Exam ?Vitals reviewed.  ?Constitutional:   ?   General: She is not in acute distress. ?   Appearance: She is not ill-appearing.  ?Cardiovascular:  ?   Rate and Rhythm: Normal rate and regular rhythm.  ?Pulmonary:  ?   Effort: Pulmonary effort is normal.  ?   Breath sounds: Normal breath sounds.  ?Neurological:  ?   Mental Status: She is alert. Mental status is at baseline.  ?Psychiatric:     ?   Behavior: Behavior normal.  ? ?ASSESSMENT/PLAN:  ? ?Weight loss ?Hx of 20 lbs weight loss over 9 months. Her weight is stable today. Reviewed OV from 3/7 and labs with [name]'s mother. Prior concern for malignancy, no indication on labs but plan to get screenings as discussed above completed. Her labs are grossly unremarkable however I did note elevated BHB and glucose of 57 at the time. Most recent A1c 5.0. She does have a history of eating less of her same portioned foods. Discussed liberating her diet and explained to her mother that she does not have diabetes. Also dicussed role of possibly offering boost or ensure with meals. Mother plans to contact insurance company to ask which is  covered and let me know. Will place prescription at that time. She is also planning to start a multivitamin.  ?We briefly touched on the topic of possible dementia driven decline in appetite. We could obtain a CT scan to find evidence of this but will not likely change management or trajectory. Will plan to complete screenings and follow up in 1 month to further discuss goals of care.  ? ?Dana Fee, DO ?[place]  ?

## 2021-12-21 ENCOUNTER — Other Ambulatory Visit: Payer: Self-pay

## 2021-12-21 DIAGNOSIS — R7309 Other abnormal glucose: Secondary | ICD-10-CM

## 2021-12-21 MED ORDER — ACCU-CHEK SOFTCLIX LANCETS MISC
12 refills | Status: DC
Start: 2021-12-21 — End: 2021-12-27

## 2021-12-21 MED ORDER — GLUCOSE BLOOD VI STRP: ORAL_STRIP | 12 refills | Status: DC

## 2021-12-21 NOTE — Telephone Encounter (Signed)
Mother of pt reqest refill of lancets as well as the test strips. .me ?

## 2021-12-26 ENCOUNTER — Ambulatory Visit (INDEPENDENT_AMBULATORY_CARE_PROVIDER_SITE_OTHER): Payer: Medicare Other | Admitting: Family Medicine

## 2021-12-26 ENCOUNTER — Encounter: Payer: Self-pay | Admitting: Family Medicine

## 2021-12-26 VITALS — BP 98/60 | Ht 59.0 in | Wt 91.0 lb

## 2021-12-26 DIAGNOSIS — E162 Hypoglycemia, unspecified: Secondary | ICD-10-CM

## 2021-12-26 DIAGNOSIS — R7309 Other abnormal glucose: Secondary | ICD-10-CM

## 2021-12-26 DIAGNOSIS — R634 Abnormal weight loss: Secondary | ICD-10-CM | POA: Diagnosis not present

## 2021-12-26 LAB — GLUCOSE, POCT (MANUAL RESULT ENTRY): POC Glucose: 65 mg/dl — AB (ref 70–99)

## 2021-12-26 NOTE — Progress Notes (Signed)
? ? ?  SUBJECTIVE:  ? ?CHIEF COMPLAINT / HPI:  ? ?Follow up, unintentional weightloss ?[name] is here today with her mother who is her caregiver. She continues to have weightloss despite eating all of her meals. Her mother endorses she has liberated her diet and added ensure to her meal plan. She finds the ensure to be rich and will sometimes give her a little at a time or add water to it. She believes it is making [name] go to the restroom more. Her mother is also concerned about her low blood sugar. She has not checked it at home because she needs more supplies. She is concerned that her body is producing too much insulin. [name] is scheduled for her mammogram, pap smear, and colonoscopy.  ? ?Serah denies dental and abdominal pain.  ? ?PERTINENT  PMH / PSH: Down syndrome ? ?OBJECTIVE:  ? ?BP 98/60   Ht 4\' 11"  (1.499 m)   Wt 91 lb (41.3 kg)   BMI 18.38 kg/m?   ?Results for orders placed or performed in visit on 12/26/21 (from the past 24 hour(s))  ?Glucose (CBG)     Status: Abnormal  ? Collection Time: 12/26/21  2:22 PM  ?Result Value Ref Range  ? POC Glucose 65 (A) 70 - 99 mg/dl  ? ?General: No acute distress. Petite appearance.  ?Cardiac: RRR, normal heart sounds, no murmurs ?Respiratory: CTAB, normal effort ?Abdomen: soft, nontender, nondistended, no masses ?Neuro: alert and oriented ?Psych: normal affect ? ?ASSESSMENT/PLAN:  ?1. Unintentional weight loss ?2lb wt loss in 5 weeks. 22 lbs over 10 months. Last visit weight was stable. Discussed hx of downs with dementia could cause poor oral intake but mother endorses the patient finishing all meals. Concerned for low glucose and elevated BHB on last labwork. Her last meal was 6am this morning and POC glucose 65. Lower suspicion for insulinoma but will obtain labs today. Stressed importance of cancer screenings as scheduled above. Possible component of dementia contributing to poor PO intake Discussed offering 3 meals/day and snacks in between and taking note of amount  consumed. Dental and abdominal pain does not appear to be an issue. Will determine follow up pending work up.  ?- Glucose (CBG) ?- Proinsulin/insulin ratio ? ?2. Hypoglycemia ?65 today. Prior hx of low glucose reading. Encouraged to start logging AM fasting at home and will obtain work up below. Encouraged to eat at regular intervals. Of note patient has not eaten since 6 am this morning.  ?- Beta-hydroxybutyric acid ?- Insulin and C-Peptide ?- Proinsulin ?- Proinsulin/insulin ratio ? ?3. Other abnormal glucose ?- Sulfonylurea Hypoglycemics Panel, blood ?- Proinsulin/insulin ratio ? ?Martha Ellerby Autry-Lott, DO ?[facility]  ?

## 2021-12-26 NOTE — Patient Instructions (Addendum)
It was wonderful to see you today. ? ?Please bring ALL of your medications with you to every visit.  ? ?Today we talked about: ? ?Testing for low blood sugar. When this is available I will notify you.  In the meantime please continue to liberate [name]'s diet.  I would encourage attempting 3 meals a day with snacks in between.  Follow-up in 1 month or sooner if needed. ? ?Please be sure to schedule follow up at the front  desk before you leave today.  ? ?Please call the clinic at [phone] if your symptoms worsen or you have any concerns. It was our pleasure to serve you. ? ?Dr. Salvadore Dom ? ?

## 2021-12-27 ENCOUNTER — Other Ambulatory Visit (HOSPITAL_COMMUNITY)
Admission: RE | Admit: 2021-12-27 | Discharge: 2021-12-27 | Disposition: A | Discharge status: Home / Self Care | Payer: Medicare Other | Source: Other Acute Inpatient Hospital | Attending: Family Medicine | Admitting: Family Medicine

## 2021-12-27 DIAGNOSIS — R634 Abnormal weight loss: Secondary | ICD-10-CM | POA: Insufficient documentation

## 2021-12-27 DIAGNOSIS — E162 Hypoglycemia, unspecified: Secondary | ICD-10-CM | POA: Insufficient documentation

## 2021-12-27 LAB — BETA-HYDROXYBUTYRIC ACID: Beta-Hydroxybutyric Acid: 0.19 mmol/L (ref 0.05–0.27)

## 2021-12-27 MED ORDER — GLUCOSE BLOOD VI STRP: ORAL_STRIP | 12 refills | Status: DC

## 2021-12-27 MED ORDER — ACCU-CHEK SOFTCLIX LANCETS MISC: 12 refills | Status: DC

## 2021-12-28 LAB — INSULIN AND C-PEPTIDE, SERUM
C-Peptide: 2.2 ng/mL (ref 1.1–4.4)
Insulin: 6.4 u[IU]/mL (ref 2.6–24.9)

## 2022-01-12 LAB — SULFONYLUREA HYPOGLYCEMICS PANEL, SERUM
Acetohexamide: NEGATIVE ug/mL (ref 20–60)
Chlorpropamide: NEGATIVE ug/mL (ref 75–250)
Glimepiride: NEGATIVE ng/mL (ref 80–250)
Glipizide: NEGATIVE ng/mL (ref 200–1000)
Glyburide: NEGATIVE ng/mL
Nateglinide: NEGATIVE ng/mL
Repaglinide: NEGATIVE ng/mL
Tolazamide: NEGATIVE ug/mL
Tolbutamide: NEGATIVE ug/mL (ref 40–100)

## 2022-01-15 ENCOUNTER — Other Ambulatory Visit: Payer: Self-pay | Admitting: Family Medicine

## 2022-01-16 ENCOUNTER — Telehealth: Payer: Self-pay

## 2022-01-16 ENCOUNTER — Encounter: Payer: Self-pay | Admitting: Physician Assistant

## 2022-01-16 NOTE — Telephone Encounter (Signed)
Scheduled

## 2022-01-16 NOTE — Telephone Encounter (Signed)
Mother calls nurse line requesting to increase Clonazepam from 1/2 tab per day to full tab.  ? ?Mother reports taking one tab per day is suiting her needs better.  ? ?Will forward to PCP for advisement.  ?

## 2022-01-24 ENCOUNTER — Ambulatory Visit: Payer: Medicare Other

## 2022-01-26 ENCOUNTER — Telehealth: Payer: Self-pay

## 2022-01-26 ENCOUNTER — Ambulatory Visit (INDEPENDENT_AMBULATORY_CARE_PROVIDER_SITE_OTHER): Payer: Medicare Other | Admitting: Family Medicine

## 2022-01-26 ENCOUNTER — Other Ambulatory Visit (HOSPITAL_COMMUNITY)
Admission: RE | Admit: 2022-01-26 | Discharge: 2022-01-26 | Disposition: A | Discharge status: Home / Self Care | Payer: Medicare Other | Attending: Family Medicine | Admitting: Family Medicine

## 2022-01-26 ENCOUNTER — Encounter: Payer: Self-pay | Admitting: Family Medicine

## 2022-01-26 VITALS — BP 104/61 | Wt 91.2 lb

## 2022-01-26 DIAGNOSIS — R634 Abnormal weight loss: Secondary | ICD-10-CM

## 2022-01-26 LAB — CBC WITH DIFFERENTIAL/PLATELET
Abs Immature Granulocytes: 0.02 10*3/uL (ref 0.00–0.07)
Basophils Absolute: 0.1 10*3/uL (ref 0.0–0.1)
Basophils Relative: 2 %
Eosinophils Absolute: 0.1 10*3/uL (ref 0.0–0.5)
Eosinophils Relative: 1 %
HCT: 41.9 % (ref 36.0–46.0)
Hemoglobin: 14.2 g/dL (ref 12.0–15.0)
Immature Granulocytes: 1 %
Lymphocytes Relative: 23 %
Lymphs Abs: 1 10*3/uL (ref 0.7–4.0)
MCH: 35.4 pg — ABNORMAL HIGH (ref 26.0–34.0)
MCHC: 33.9 g/dL (ref 30.0–36.0)
MCV: 104.5 fL — ABNORMAL HIGH (ref 80.0–100.0)
Monocytes Absolute: 0.2 10*3/uL (ref 0.1–1.0)
Monocytes Relative: 4 %
Neutro Abs: 2.9 10*3/uL (ref 1.7–7.7)
Neutrophils Relative %: 69 %
Platelets: 291 10*3/uL (ref 150–400)
RBC: 4.01 MIL/uL (ref 3.87–5.11)
RDW: 13.4 % (ref 11.5–15.5)
WBC: 4.2 10*3/uL (ref 4.0–10.5)
nRBC: 0 % (ref 0.0–0.2)

## 2022-01-26 LAB — VITAMIN B12: Vitamin B-12: UNDETERMINED pg/mL (ref 180–914)

## 2022-01-26 LAB — FOLATE: Folate: 10.6 ng/mL (ref >5.9)

## 2022-01-26 NOTE — Telephone Encounter (Signed)
[facility] calls nurse line reporting insufficient sample to obtain B12.   I called mother and informed her of this. Mother reports she gets her labs drawn at the hospital and will arrange a time next week to have B12 redrawn.   Will forward to PCP.

## 2022-01-26 NOTE — Progress Notes (Unsigned)
    SUBJECTIVE:   CHIEF COMPLAINT / HPI:   Follow up, unintentional weight loss Here today to follow up on last set of labs and endorsing weight loss without significant change in appetite.   4/25 OV-[name] is here today with her mother who is her caregiver. She continues to have weightloss despite eating all of her meals. Her mother endorses she has liberated her diet and added ensure to her meal plan. She finds the ensure to be rich and will sometimes give her a little at a time or add water to it. She believes it is making [name] go to the restroom more. Her mother is also concerned about her low blood sugar. She has not checked it at home because she needs more supplies. She is concerned that her body is producing too much insulin. [name] is scheduled for her mammogram, pap smear, and colonoscopy.    [name] denies dental and abdominal pain.   PERTINENT  PMH / PSH: Downs syndrome  OBJECTIVE:   BP 104/61   Wt 91 lb 3.2 oz (41.4 kg)   BMI 18.42 kg/m   General: Appears well, no acute distress. Age appropriate. Cardiac: RRR, normal heart sounds, no murmurs Respiratory: CTAB, normal effort Abdomen: soft, nontender, nondistended Extremities: No edema or cyanosis. Skin: Warm and dry, no rashes noted Neuro: alert and oriented, no focal deficits Psych: normal affect  ASSESSMENT/PLAN:   Unintentional weight loss 2-3 oz gain since last visit. Pro-insulin lvls slightly elevated with hx of hypoglycemia; discussed with mother about insulinoma and CT needed for diagnosis, she plans to think about this. Hgb and MCV elevated on lab work, will obtain further work up below. Today she appears well suspicion for dementia related to decreased PO intake remains high and malignancy is a possibility (she has never had a pap, mammogram, or colonoscopy and was declined by her mother since these would require sedation). Continue to monitor weight trend.  - CBC with Differential; Future - Vitamin B12; Future -  Folate; Future - Pathologist smear review; Future    [name], [place]

## 2022-01-26 NOTE — Patient Instructions (Addendum)
It was wonderful to see you today.  Please bring ALL of your medications with you to every visit.   Today we talked about:  -Getting follow up labs and considering getting an abdomen CT scan  Please be sure to schedule follow up at the front  desk before you leave today.   Please call the clinic at [phone] if your symptoms worsen or you have any concerns. It was our pleasure to serve you.  Dr. Salvadore Dom

## 2022-01-30 ENCOUNTER — Other Ambulatory Visit (HOSPITAL_COMMUNITY)
Admission: RE | Admit: 2022-01-30 | Discharge: 2022-01-30 | Disposition: A | Discharge status: Home / Self Care | Payer: Medicare Other | Source: Ambulatory Visit

## 2022-01-30 ENCOUNTER — Other Ambulatory Visit: Payer: Self-pay

## 2022-01-30 DIAGNOSIS — R634 Abnormal weight loss: Secondary | ICD-10-CM

## 2022-01-30 LAB — VITAMIN B12: Vitamin B-12: 602 pg/mL (ref 180–914)

## 2022-01-31 LAB — PATHOLOGIST SMEAR REVIEW

## 2022-02-01 ENCOUNTER — Ambulatory Visit
Admission: RE | Admit: 2022-02-01 | Discharge: 2022-02-01 | Disposition: A | Discharge status: Home / Self Care | Payer: Medicare Other | Source: Ambulatory Visit | Attending: Family Medicine | Admitting: Family Medicine

## 2022-02-01 DIAGNOSIS — R634 Abnormal weight loss: Secondary | ICD-10-CM

## 2022-02-01 DIAGNOSIS — Z1231 Encounter for screening mammogram for malignant neoplasm of breast: Secondary | ICD-10-CM | POA: Diagnosis not present

## 2022-02-03 ENCOUNTER — Encounter: Payer: Self-pay | Admitting: Family Medicine

## 2022-02-06 ENCOUNTER — Encounter: Payer: Self-pay | Admitting: *Deleted

## 2022-02-06 NOTE — Progress Notes (Unsigned)
02/07/2022 [name] [id number] [birth year]  Referring provider: [name], DO Primary GI doctor: Dr. Lyndel Safe  ASSESSMENT AND PLAN:   Unintentional weight loss with normal appetite, possible splenomegaly on exam with AB pain -     US Abdomen Complete; Future -     IBC + Ferritin; Future -     Tissue transglutaminase, IgA; Future -     IgA; Future -     Sedimentation rate; Future -     TSH; Future -     Cortisol Will recheck TSH, will check cortisol with some hypotension/weight loss,  Will check celiac though likely negative.  Long discussion with mother, would like to avoid radiation so will proceed with AB [place] first due to weight loss and possible splenomegaly on exam with AB pain.  If negative will proceed with colonoscopy.  Will check for anemia, if iron def will plan for EGD/Colon.  We have discussed the risks of bleeding, infection, perforation, medication reactions, and remote risk of death associated with colonoscopy. All questions were answered and the patient's mother acknowledges these risk and wishes to proceed.  Renal insufficiency Has seen urology  Generalized abdominal pain/constipation -     US Abdomen Complete; Future -     IBC + Ferritin; Future Most likely from constipation - Increase fiber/ water intake, decrease caffeine, increase activity level. -Will add on Miralax daily and Benefiber - Please go to the hospital if you have severe abdominal pain, vomiting, fever, CP, SOB.   Anemia, unspecified type -     IBC + Ferritin; Future, normal folate, B12 If patient has iron def, will plan for EGD and colon to evaluate, patient's mother agrees.    Patient Care Team: [name], DO as PCP - General (Family Medicine)  HISTORY OF PRESENT ILLNESS: 51 y.o. female with a past medical history of hyperlipidemia, mood disorder, PCOS, obesity, Down syndrome and others listed below presents for evaluation of weight loss.  Mother's caregiver, gives  majority of history.  She had blood clots March 2020, she was on BCP which was stopped.   Seen by primary care 01/26/2022 for weight loss despite eating meals and the addition of Ensure. Patient's had normal morning insulin level, and very slightly elevated proinsulin at 10.2, negative sulfonylurea hypoglycemics panel Hemoglobin 14.2, MCV 104.5, folate 10.6, B12 602, pathology review showed macrocytosis without blasts Recent normal mammogram. Has not had any abdominal imaging.  Has had decrease weight over 6-12 months, mother changed her diet due to elevated sugars, has lost about 30 lbs in 6 months. In 2021 A1C was 8.1, most recent checked 03/28/2021 at 5.5.  Since March/April has been having low sugars.   Patient denies GERD.  Patient denies dysphagia, nausea, vomiting, melena.  Patient has a BM every 2-3 days, large volume, formed stools, no blood in stool. .  Patient denies change in bowel habits, constipation, diarrhea, hematochezia. She was holding her stomach when using the restroom, just at the beginning, took to urologist with negative cystoscopy.  Denies changes in appetite Patient denies family history of colon cancer or other gastrointestinal malignancies. Denies NSAIDS.  No dizziness, some low blood pressures but no symptoms.   Wt Readings from Last 10 Encounters:  02/07/22 92 lb (41.7 kg)  01/26/22 91 lb 3.2 oz (41.4 kg)  12/26/21 91 lb (41.3 kg)  11/21/21 93 lb 6.4 oz (42.4 kg)  11/07/21 93 lb 3.2 oz (42.3 kg)  02/27/21 119 lb 12.8 oz (54.3 kg)  03/02/20 123 lb 9.6 oz (56.1 kg)  03/03/19 131 lb 3.2 oz (59.5 kg)  01/14/19 133 lb (60.3 kg)  11/07/18 141 lb (64 kg)   CBC  01/26/2022  HGB 14.2 MCV 104.5 without evidence of anemia WBC 4.2 Platelets 291 01/30/2022  B12 602 Kidney function 11/07/2021  BUN 17 Cr 0.90  GFR >60  Potassium 3.9   LFTs 11/07/2021  AST 28 ALT 16 Alkphos 57 TBili 0.8 11/07/2021 TSH 1.529   Current Medications:        Current Outpatient  Medications (Other):    Accu-Chek Softclix Lancets lancets, Use as instructed   Blood Glucose Monitoring Suppl (ACCU-CHEK AVIVA PLUS) w/Device KIT, use as directed   clonazePAM (KLONOPIN) 0.5 MG tablet, TAKE 1/2 TABLET(0.25 MG) BY MOUTH DAILY AS NEEDED FOR ANXIETY   glucose blood (ACCU-CHEK AVIVA) test strip, Use to check sugar daily as needed.   traZODone (DESYREL) 50 MG tablet, Take 1/2-1 tablest by mouth at bedtime as needed for insomnia  Medical History:  Past Medical History:  Diagnosis Date   Down syndrome    Hyperlipidemia    Mood disorder (HCC)    Obesity    PCOS (polycystic ovarian syndrome)    Allergies: No Known Allergies   Surgical History:  She  has a past surgical history that includes Cystoscopy. Family History:  Her family history is not on file. Social History:   reports that she has never smoked. She has never used smokeless tobacco. She reports that she does not drink alcohol and does not use drugs.  REVIEW OF SYSTEMS  : All other systems reviewed and negative except where noted in the History of Present Illness.  PHYSICAL EXAM: BP 102/64   Pulse 88   Ht 4' 11"  (1.499 m)   Wt 92 lb (41.7 kg)   SpO2 96%   BMI 18.58 kg/m  General:  thin appearing female in no acute distress Head:   Normocephalic and atraumatic. Eyes:  sclerae anicteric,conjunctive pink  Heart:   regular rate and rhythm Pulm:  Clear anteriorly; no wheezing Abdomen:   Soft, Non-distended AB, Sluggish bowel sounds. Possible mild LLQ tenderness, some LLQ fullness most likely stool, some possible splenomegaly on exam.  Rectal: Not evaluated Extremities:  Without edema. Msk: Symmetrical without gross deformities. Peripheral pulses intact.  Neurologic:  Alert and  oriented, non verbal during this visit;  No focal deficits.  Skin:   Dry and intact without significant lesions or rashes. Psychiatric:  Cooperative.    Vladimir Crofts, PA-C 9:46 AM

## 2022-02-07 ENCOUNTER — Encounter: Payer: Self-pay | Admitting: Physician Assistant

## 2022-02-07 ENCOUNTER — Other Ambulatory Visit (INDEPENDENT_AMBULATORY_CARE_PROVIDER_SITE_OTHER): Payer: Medicare Other

## 2022-02-07 ENCOUNTER — Ambulatory Visit (INDEPENDENT_AMBULATORY_CARE_PROVIDER_SITE_OTHER): Payer: Medicare Other | Admitting: Physician Assistant

## 2022-02-07 ENCOUNTER — Other Ambulatory Visit: Payer: Medicare Other

## 2022-02-07 VITALS — BP 102/64 | HR 88 | Ht 59.0 in | Wt 92.0 lb

## 2022-02-07 DIAGNOSIS — R1084 Generalized abdominal pain: Secondary | ICD-10-CM

## 2022-02-07 DIAGNOSIS — D649 Anemia, unspecified: Secondary | ICD-10-CM | POA: Diagnosis not present

## 2022-02-07 DIAGNOSIS — R634 Abnormal weight loss: Secondary | ICD-10-CM | POA: Diagnosis not present

## 2022-02-07 DIAGNOSIS — N289 Disorder of kidney and ureter, unspecified: Secondary | ICD-10-CM

## 2022-02-07 DIAGNOSIS — R161 Splenomegaly, not elsewhere classified: Secondary | ICD-10-CM

## 2022-02-07 LAB — COMPREHENSIVE METABOLIC PANEL
ALT: 13 U/L (ref 0–35)
AST: 21 U/L (ref 0–37)
Albumin: 4.2 g/dL (ref 3.5–5.2)
Alkaline Phosphatase: 62 U/L (ref 39–117)
BUN: 18 mg/dL (ref 6–23)
CO2: 24 mEq/L (ref 19–32)
Calcium: 9.4 mg/dL (ref 8.4–10.5)
Chloride: 105 mEq/L (ref 96–112)
Creatinine, Ser: 0.85 mg/dL (ref 0.40–1.20)
GFR: 79.38 mL/min (ref >60.00)
Glucose, Bld: 70 mg/dL (ref 70–99)
Potassium: 4.1 mEq/L (ref 3.5–5.1)
Sodium: 143 mEq/L (ref 135–145)
Total Bilirubin: 0.4 mg/dL (ref 0.2–1.2)
Total Protein: 7.1 g/dL (ref 6.0–8.3)

## 2022-02-07 LAB — CBC WITH DIFFERENTIAL/PLATELET
Basophils Absolute: 0.1 10*3/uL (ref 0.0–0.1)
Basophils Relative: 2.2 % (ref 0.0–3.0)
Eosinophils Absolute: 0 10*3/uL (ref 0.0–0.7)
Eosinophils Relative: 1.1 % (ref 0.0–5.0)
HCT: 43.4 % (ref 36.0–46.0)
Hemoglobin: 14.7 g/dL (ref 12.0–15.0)
Lymphocytes Relative: 23.1 % (ref 12.0–46.0)
Lymphs Abs: 0.9 10*3/uL (ref 0.7–4.0)
MCHC: 33.8 g/dL (ref 30.0–36.0)
MCV: 105.2 fl — ABNORMAL HIGH (ref 78.0–100.0)
Monocytes Absolute: 0.1 10*3/uL (ref 0.1–1.0)
Monocytes Relative: 3.7 % (ref 3.0–12.0)
Neutro Abs: 2.7 10*3/uL (ref 1.4–7.7)
Neutrophils Relative %: 69.9 % (ref 43.0–77.0)
Platelets: 283 10*3/uL (ref 150.0–400.0)
RBC: 4.12 Mil/uL (ref 3.87–5.11)
RDW: 14.1 % (ref 11.5–15.5)
WBC: 3.9 10*3/uL — ABNORMAL LOW (ref 4.0–10.5)

## 2022-02-07 LAB — TSH: TSH: 1.1 u[IU]/mL (ref 0.35–5.50)

## 2022-02-07 LAB — SEDIMENTATION RATE: Sed Rate: 27 mm/hr (ref 0–30)

## 2022-02-07 LAB — IBC + FERRITIN
Ferritin: 276.5 ng/mL (ref 10.0–291.0)
Iron: 85 ug/dL (ref 42–145)
Saturation Ratios: 26.4 % (ref 20.0–50.0)
TIBC: 322 ug/dL (ref 250.0–450.0)
Transferrin: 230 mg/dL (ref 212.0–360.0)

## 2022-02-07 LAB — CORTISOL: Cortisol, Plasma: 6 ug/dL

## 2022-02-07 NOTE — Patient Instructions (Addendum)
Your provider has requested that you go to the basement level for lab work before leaving today. Press "B" on the elevator. The lab is located at the first door on the left as you exit the elevator.   You have been scheduled for an abdominal ultrasound at [facility] (1st floor of hospital) on 02/14/22 at 10:30am. Please arrive 15 minutes prior to your appointment for registration. Make certain not to have anything to eat or drink 6 hours prior to your appointment. Should you need to reschedule your appointment, please contact radiology at [phone]. This test typically takes about 30 minutes to perform.   Recommend starting on a fiber supplement, can try metamucil first but if this causes gas/bloating switch to benefiber Take with fiber with with a full 8 oz glass of water once a day. This can take 1 month to start helping, so try for at least one month.  Recommend increasing water and activity.  Get a squatty potty to use at home or try a stool, goal is to get your knees above your hips during a bowel movement.  Miralax is an osmotic laxative.  It only brings more water into the stool.  This is safe to take daily.  Can take up to 17 gram of miralax twice a day.  Mix with juice or coffee.  Start 1/2 capful at night for 3-4 days and reassess your response in 3-4 days.  You can increase and decrease the dose based on your response.  Remember, it can take up to 3-4 days to take effect OR for the effects to wear off.   I often pair this with benefiber in the morning to help assure the stool is not too loose.    - Drink at least 64-80 ounces of water/liquid per day. - Establish a time to try to move your bowels every day.  For many people, this is after a cup of coffee or after a meal such as breakfast. - Sit all of the way back on the toilet keeping your back fairly straight and while sitting up, try to rest the tops of your forearms on your upper thighs.   - Raising your feet with  a step stool/squatty potty can be helpful to improve the angle that allows your stool to pass through the rectum. - Relax the rectum feeling it bulge toward the toilet water.  If you feel your rectum raising toward your body, you are contracting rather than relaxing. - Breathe in and slowly exhale. "Belly breath" by expanding your belly towards your belly button. Keep belly expanded as you gently direct pressure down and back to the anus.  A low pitched GRRR sound can assist with increasing intra-abdominal pressure.  - Repeat 3-4 times. If unsuccessful, contract the pelvic floor to restore normal tone and get off the toilet.  Avoid excessive straining. - To reduce excessive wiping by teaching your anus to normally contract, place hands on outer aspect of knees and resist knee movement outward.  Hold 5-10 second then place hands just inside of knees and resist inward movement of knees.  Hold 5 seconds.  Repeat a few times each way.  Go to the ER if unable to pass gas, severe AB pain, unable to hold down food, any shortness of breath of chest pain.   Constipation, Adult Constipation is when a person has fewer than three bowel movements in a week, has difficulty having a bowel movement, or has stools (feces) that are dry, hard,  or larger than normal. Constipation may be caused by an underlying condition. It may become worse with age if a person takes certain medicines and does not take in enough fluids. Follow these instructions at home: Eating and drinking  Eat foods that have a lot of fiber, such as beans, whole grains, and fresh fruits and vegetables. Limit foods that are low in fiber and high in fat and processed sugars, such as fried or sweet foods. These include french fries, hamburgers, cookies, candies, and soda. Drink enough fluid to keep your urine pale yellow. General instructions Exercise regularly or as told by your health care provider. Try to do 150 minutes of moderate exercise each  week. Use the bathroom when you have the urge to go. Do not hold it in. Take over-the-counter and prescription medicines only as told by your health care provider. This includes any fiber supplements. During bowel movements: Practice deep breathing while relaxing the lower abdomen. Practice pelvic floor relaxation. Watch your condition for any changes. Let your health care provider know about them. Keep all follow-up visits as told by your health care provider. This is important. Contact a health care provider if: You have pain that gets worse. You have a fever. You do not have a bowel movement after 4 days. You vomit. You are not hungry or you lose weight. You are bleeding from the opening between the buttocks (anus). You have thin, pencil-like stools. Get help right away if: You have a fever and your symptoms suddenly get worse. You leak stool or have blood in your stool. Your abdomen is bloated. You have severe pain in your abdomen. You feel dizzy or you faint. Summary Constipation is when a person has fewer than three bowel movements in a week, has difficulty having a bowel movement, or has stools (feces) that are dry, hard, or larger than normal. Eat foods that have a lot of fiber, such as beans, whole grains, and fresh fruits and vegetables. Drink enough fluid to keep your urine pale yellow. Take over-the-counter and prescription medicines only as told by your health care provider. This includes any fiber supplements. This information is not intended to replace advice given to you by your health care provider. Make sure you discuss any questions you have with your health care provider. Document Revised: 07/08/2019 Document Reviewed: 07/08/2019 Elsevier Patient Education  [place].

## 2022-02-08 LAB — IGA: Immunoglobulin A: 319 mg/dL — ABNORMAL HIGH (ref 47–310)

## 2022-02-08 LAB — TISSUE TRANSGLUTAMINASE, IGA: (tTG) Ab, IgA: <1 U/mL

## 2022-02-09 ENCOUNTER — Other Ambulatory Visit: Payer: Self-pay

## 2022-02-09 ENCOUNTER — Other Ambulatory Visit (INDEPENDENT_AMBULATORY_CARE_PROVIDER_SITE_OTHER): Payer: Medicare Other

## 2022-02-09 DIAGNOSIS — R1084 Generalized abdominal pain: Secondary | ICD-10-CM | POA: Diagnosis not present

## 2022-02-09 DIAGNOSIS — N289 Disorder of kidney and ureter, unspecified: Secondary | ICD-10-CM

## 2022-02-09 DIAGNOSIS — R634 Abnormal weight loss: Secondary | ICD-10-CM

## 2022-02-09 LAB — CORTISOL: Cortisol, Plasma: 7.2 ug/dL

## 2022-02-11 NOTE — Progress Notes (Signed)
Agree with assessment/plan RG 

## 2022-02-14 ENCOUNTER — Ambulatory Visit (HOSPITAL_COMMUNITY)
Admission: RE | Admit: 2022-02-14 | Discharge: 2022-02-14 | Disposition: A | Discharge status: Home / Self Care | Payer: Medicare Other | Source: Ambulatory Visit | Attending: Physician Assistant | Admitting: Physician Assistant

## 2022-02-14 DIAGNOSIS — R1084 Generalized abdominal pain: Secondary | ICD-10-CM | POA: Diagnosis not present

## 2022-02-14 DIAGNOSIS — R634 Abnormal weight loss: Secondary | ICD-10-CM | POA: Insufficient documentation

## 2022-02-15 ENCOUNTER — Telehealth: Payer: Self-pay

## 2022-02-15 ENCOUNTER — Other Ambulatory Visit: Payer: Self-pay

## 2022-02-15 DIAGNOSIS — R1084 Generalized abdominal pain: Secondary | ICD-10-CM

## 2022-02-15 DIAGNOSIS — R634 Abnormal weight loss: Secondary | ICD-10-CM

## 2022-02-15 DIAGNOSIS — D649 Anemia, unspecified: Secondary | ICD-10-CM

## 2022-02-15 MED ORDER — CLENPIQ 10-3.5-12 MG-GM -GM/160ML PO SOLN: 1.0000 | ORAL | 0 refills | Status: DC

## 2022-02-15 NOTE — Telephone Encounter (Signed)
Spoke with patient's mother regarding results & recommendations. Patient has been scheduled for colon in [facility] with Dr. Chales Abrahams on 04/11/22 at 10:30 am. Amb ref placed. Prep has been sent to pharmacy & instructions sent to mychart. Patient is not on any blood thinners or diabetic. Patient's mother did say that patient may have a difficult time drinking prep. I have sent 2 day prep instructions in to allow patient more time to drink prep. CT has been ordered as well, and schedulers notified. Patient mother is aware & verbalized all understanding.

## 2022-02-20 ENCOUNTER — Other Ambulatory Visit: Payer: Self-pay | Admitting: Family Medicine

## 2022-03-23 ENCOUNTER — Ambulatory Visit (HOSPITAL_COMMUNITY)
Admission: RE | Admit: 2022-03-23 | Discharge: 2022-03-23 | Disposition: A | Discharge status: Home / Self Care | Payer: Medicare Other | Source: Ambulatory Visit | Attending: Physician Assistant | Admitting: Physician Assistant

## 2022-03-23 DIAGNOSIS — R1084 Generalized abdominal pain: Secondary | ICD-10-CM | POA: Insufficient documentation

## 2022-03-23 DIAGNOSIS — R634 Abnormal weight loss: Secondary | ICD-10-CM | POA: Diagnosis not present

## 2022-03-23 DIAGNOSIS — R109 Unspecified abdominal pain: Secondary | ICD-10-CM | POA: Diagnosis not present

## 2022-03-23 MED ORDER — IOHEXOL 9 MG/ML PO SOLN: 500.0000 mL | ORAL | Status: AC

## 2022-03-23 MED ORDER — IOHEXOL 9 MG/ML PO SOLN
ORAL | Status: AC
  Filled 2022-03-23: qty 1000

## 2022-03-23 MED ORDER — IOHEXOL 300 MG/ML  SOLN
100.0000 mL | Freq: Once | INTRAMUSCULAR | Status: AC | PRN
Start: 2022-03-23 — End: 2022-03-23
  Administered 2022-03-23: 80 mL via INTRAVENOUS

## 2022-04-09 NOTE — Telephone Encounter (Signed)
Spoke with patient's mother & we went over previous recommendations. She verbalized all understanding.

## 2022-04-09 NOTE — Telephone Encounter (Signed)
Patients mother called requesting to speak with a nurse regarding the upcoming colonoscopy that is scheduled would like to know why the patient needs to have the procedure done.

## 2022-04-11 ENCOUNTER — Ambulatory Visit (AMBULATORY_SURGERY_CENTER): Payer: Medicare Other | Admitting: Gastroenterology

## 2022-04-11 ENCOUNTER — Encounter: Payer: Self-pay | Admitting: Gastroenterology

## 2022-04-11 VITALS — BP 110/72 | HR 72 | Temp 97.8°F | Resp 16 | Ht 59.0 in | Wt 92.0 lb

## 2022-04-11 DIAGNOSIS — K641 Second degree hemorrhoids: Secondary | ICD-10-CM | POA: Diagnosis not present

## 2022-04-11 DIAGNOSIS — F840 Autistic disorder: Secondary | ICD-10-CM | POA: Diagnosis not present

## 2022-04-11 DIAGNOSIS — R634 Abnormal weight loss: Secondary | ICD-10-CM

## 2022-04-11 DIAGNOSIS — E785 Hyperlipidemia, unspecified: Secondary | ICD-10-CM | POA: Diagnosis not present

## 2022-04-11 DIAGNOSIS — R109 Unspecified abdominal pain: Secondary | ICD-10-CM | POA: Diagnosis not present

## 2022-04-11 MED ORDER — SODIUM CHLORIDE 0.9 % IV SOLN: 500.0000 mL | Freq: Once | INTRAVENOUS | Status: DC

## 2022-04-11 NOTE — Patient Instructions (Signed)
YOU HAD AN ENDOSCOPIC PROCEDURE TODAY AT THE [facility]:   Refer to the procedure report that was given to you for any specific questions about what was found during the examination.  If the procedure report does not answer your questions, please call your gastroenterologist to clarify.  If you requested that your care partner not be given the details of your procedure findings, then the procedure report has been included in a sealed envelope for you to review at your convenience later.  YOU SHOULD EXPECT: Some feelings of bloating in the abdomen. Passage of more gas than usual.  Walking can help get rid of the air that was put into your GI tract during the procedure and reduce the bloating. If you had a lower endoscopy (such as a colonoscopy or flexible sigmoidoscopy) you may notice spotting of blood in your stool or on the toilet paper. If you underwent a bowel prep for your procedure, you may not have a normal bowel movement for a few days.  Please Note:  You might notice some irritation and congestion in your nose or some drainage.  This is from the oxygen used during your procedure.  There is no need for concern and it should clear up in a day or so.  SYMPTOMS TO REPORT IMMEDIATELY:  Following lower endoscopy (colonoscopy or flexible sigmoidoscopy):  Excessive amounts of blood in the stool  Significant tenderness or worsening of abdominal pains  Swelling of the abdomen that is new, acute  Fever of 100F or higher  Following upper endoscopy (EGD)  Vomiting of blood or coffee ground material  New chest pain or pain under the shoulder blades  Painful or persistently difficult swallowing  New shortness of breath  Fever of 100F or higher  Black, tarry-looking stools  For urgent or emergent issues, a gastroenterologist can be reached at any hour by calling [phone]. Do not use MyChart messaging for urgent concerns.    DIET:  We do recommend a small meal at first, but  then you may proceed to your regular diet.  Drink plenty of fluids but you should avoid alcoholic beverages for 24 hours.  ACTIVITY:  You should plan to take it easy for the rest of today and you should NOT DRIVE or use heavy machinery until tomorrow (because of the sedation medicines used during the test).    FOLLOW UP: Our staff will call the number listed on your records the next business day following your procedure.  We will call around 7:15- 8:00 am to check on you and address any questions or concerns that you may have regarding the information given to you following your procedure. If we do not reach you, we will leave a message.  If you develop any symptoms (ie: fever, flu-like symptoms, shortness of breath, cough etc.) before then, please call [phone].  If you test positive for Covid 19 in the 2 weeks post procedure, please call and report this information to us.    If any biopsies were taken you will be contacted by phone or by letter within the next 1-3 weeks.  Please call us at [phone] if you have not heard about the biopsies in 3 weeks.    SIGNATURES/CONFIDENTIALITY: You and/or your care partner have signed paperwork which will be entered into your electronic medical record.  These signatures attest to the fact that that the information above on your After Visit Summary has been reviewed and is understood.  Full responsibility of the confidentiality   of this discharge information lies with you and/or your care-partner.  

## 2022-04-11 NOTE — Progress Notes (Signed)
Report given to PACU, vss 

## 2022-04-11 NOTE — Op Note (Signed)
[facility] Patient Name: [name] Procedure Date: 04/11/2022 10:25 AM MRN: [id number] Endoscopist: Lynann Bologna , MD Age: 51 Referring MD:  Date of Birth: 30-Nov-1970 Gender: Female Account #: [id number] Procedure:                Colonoscopy Indications:              Contipation. Wt loss. Medicines:                Monitored Anesthesia Care Procedure:                Pre-Anesthesia Assessment:                           - Prior to the procedure, a History and Physical                            was performed, and patient medications and                            allergies were reviewed. The patient's tolerance of                            previous anesthesia was also reviewed. The risks                            and benefits of the procedure and the sedation                            options and risks were discussed with the patient.                            All questions were answered, and informed consent                            was obtained. Prior Anticoagulants: The patient has                            taken no previous anticoagulant or antiplatelet                            agents. ASA Grade Assessment: II - A patient with                            mild systemic disease. After reviewing the risks                            and benefits, the patient was deemed in                            satisfactory condition to undergo the procedure.                           After obtaining informed consent, the colonoscope  was passed under direct vision. Throughout the                            procedure, the patient's blood pressure, pulse, and                            oxygen saturations were monitored continuously. The                            PCF-HQ190L Colonoscope was introduced through the                            anus and advanced to the 2 cm into the ileum. The                            colonoscopy was performed without  difficulty. The                            patient tolerated the procedure well. The quality                            of the bowel preparation was good. The terminal                            ileum, ileocecal valve, appendiceal orifice, and                            rectum were photographed. Scope In: 10:38:38 AM Scope Out: 10:52:53 AM Scope Withdrawal Time: 0 hours 10 minutes 28 seconds  Total Procedure Duration: 0 hours 14 minutes 15 seconds  Findings:                 The colon (entire examined portion) appeared normal.                           Non-bleeding internal hemorrhoids were found during                            retroflexion. The hemorrhoids were small and Grade                            I (internal hemorrhoids that do not prolapse).                           The terminal ileum appeared normal.                           The exam was otherwise without abnormality on                            direct and retroflexion views. Complications:            No immediate complications. Estimated Blood Loss:     Estimated blood loss: none. Impression:               -  The entire examined colon is normal.                           - Non-bleeding internal hemorrhoids.                           - The examined portion of the ileum was normal.                           - The examination was otherwise normal on direct                            and retroflexion views.                           - No specimens collected. Recommendation:           - Patient has a contact number available for                            emergencies. The signs and symptoms of potential                            delayed complications were discussed with the                            patient. Return to normal activities tomorrow.                            Written discharge instructions were provided to the                            patient.                           - Resume previous diet.                            - Continue present medications.                           - Repeat colonoscopy in 10 years for screening                            purposes. Earlier, if with any new problems or                            change in family history.                           - The findings and recommendations were discussed                            with the patient's family.                           - FU with  Marchelle Folks if continued weight loss. Lynann Bologna, MD 04/11/2022 10:59:26 AM This report has been signed electronically.

## 2022-04-11 NOTE — Progress Notes (Signed)
Vladimir Crofts, PA-C  Physician Assistant Certified Gastroenterology Progress Notes    Signed Encounter Date:  02/07/2022   Signed                                                                                                                                                                                                                                                                                                                                                                                                                                                                                                                                    02/07/2022 [name] [id number] [birth year]   Referring provider: [name], DO Primary GI doctor: Dr. Lyndel Safe   ASSESSMENT AND PLAN:    Unintentional weight loss with normal appetite, possible splenomegaly on exam with AB pain -     US Abdomen Complete; Future -     IBC + Ferritin; Future -     Tissue  transglutaminase, IgA; Future -     IgA; Future -     Sedimentation rate; Future -     TSH; Future -     Cortisol Will recheck TSH, will check cortisol with some hypotension/weight loss,  Will check celiac though likely negative.  Long discussion with mother, would like to avoid radiation so will proceed with AB [place] first due to weight loss and possible splenomegaly on exam with AB pain.  If negative will proceed with colonoscopy.  Will check for anemia, if iron def will plan for EGD/Colon.  We have discussed the risks of bleeding, infection, perforation, medication reactions, and remote risk of death associated with colonoscopy. All questions were answered and the patient's mother acknowledges these risk and wishes to proceed.   Renal insufficiency Has seen urology    Generalized abdominal pain/constipation -     US Abdomen Complete; Future -     IBC + Ferritin; Future Most likely from constipation - Increase fiber/ water intake, decrease caffeine, increase activity level. -Will add on Miralax daily and Benefiber - Please go to the hospital if you have severe abdominal pain, vomiting, fever, CP, SOB.    Anemia, unspecified type -     IBC + Ferritin; Future, normal folate, B12 If patient has iron def, will plan for EGD and colon to evaluate, patient's mother agrees.      Patient Care Team: [name], DO as PCP - General (Family Medicine)   HISTORY OF PRESENT ILLNESS: 51 y.o. female with a past medical history of hyperlipidemia, mood disorder, PCOS, obesity, Down syndrome and others listed below presents for evaluation of weight loss.  Mother's caregiver, gives majority of history.  She had blood clots March 2020, she was on BCP which was stopped.    Seen by primary care 01/26/2022 for weight loss despite eating meals and the addition of Ensure. Patient's had normal morning insulin level, and very slightly elevated proinsulin at 10.2, negative sulfonylurea hypoglycemics panel Hemoglobin 14.2, MCV 104.5, folate 10.6, B12 602, pathology review showed macrocytosis without blasts Recent normal mammogram. Has not had any abdominal imaging.   Has had decrease weight over 6-12 months, mother changed her diet due to elevated sugars, has lost about 30 lbs in 6 months. In 2021 A1C was 8.1, most recent checked 03/28/2021 at 5.5.  Since March/April has been having low sugars.    Patient denies GERD.  Patient denies dysphagia, nausea, vomiting, melena.  Patient has a BM every 2-3 days, large volume, formed stools, no blood in stool. .  Patient denies change in bowel habits, constipation, diarrhea, hematochezia. She was holding her stomach when using the restroom, just at the beginning, took to urologist with negative cystoscopy.  Denies changes in  appetite Patient denies family history of colon cancer or other gastrointestinal malignancies. Denies NSAIDS.  No dizziness, some low blood pressures but no symptoms.       Wt Readings from Last 10 Encounters:  02/07/22 92 lb (41.7 kg)  01/26/22 91 lb 3.2 oz (41.4 kg)  12/26/21 91 lb (41.3 kg)  11/21/21 93 lb 6.4 oz (42.4 kg)  11/07/21 93 lb 3.2 oz (42.3 kg)  02/27/21 119 lb 12.8 oz (54.3 kg)  03/02/20 123 lb 9.6 oz (56.1 kg)  03/03/19 131 lb 3.2 oz (59.5 kg)  01/14/19 133 lb (60.3 kg)  11/07/18 141 lb (64 kg)    CBC  01/26/2022  HGB 14.2 MCV 104.5 without evidence of anemia WBC 4.2 Platelets 291 01/30/2022  B12  602 Kidney function 11/07/2021  BUN 17 Cr 0.90  GFR >60  Potassium 3.9   LFTs 11/07/2021  AST 28 ALT 16 Alkphos 57 TBili 0.8 11/07/2021 TSH 1.529     Current Medications:              Current Outpatient Medications (Other):    Accu-Chek Softclix Lancets lancets, Use as instructed   Blood Glucose Monitoring Suppl (ACCU-CHEK AVIVA PLUS) w/Device KIT, use as directed   clonazePAM (KLONOPIN) 0.5 MG tablet, TAKE 1/2 TABLET(0.25 MG) BY MOUTH DAILY AS NEEDED FOR ANXIETY   glucose blood (ACCU-CHEK AVIVA) test strip, Use to check sugar daily as needed.   traZODone (DESYREL) 50 MG tablet, Take 1/2-1 tablest by mouth at bedtime as needed for insomnia   Medical History:      Past Medical History:  Diagnosis Date   Down syndrome     Hyperlipidemia     Mood disorder (HCC)     Obesity     PCOS (polycystic ovarian syndrome)      Allergies: No Known Allergies    Surgical History:  She  has a past surgical history that includes Cystoscopy. Family History:  Her family history is not on file. Social History:   reports that she has never smoked. She has never used smokeless tobacco. She reports that she does not drink alcohol and does not use drugs.   REVIEW OF SYSTEMS  : All other systems reviewed and negative except where noted in the History of Present Illness.    PHYSICAL EXAM: BP 102/64   Pulse 88   Ht 4' 11"  (1.499 m)   Wt 92 lb (41.7 kg)   SpO2 96%   BMI 18.58 kg/m  General:  thin appearing female in no acute distress Head:   Normocephalic and atraumatic. Eyes:  sclerae anicteric,conjunctive pink  Heart:   regular rate and rhythm Pulm:  Clear anteriorly; no wheezing Abdomen:   Soft, Non-distended AB, Sluggish bowel sounds. Possible mild LLQ tenderness, some LLQ fullness most likely stool, some possible splenomegaly on exam.  Rectal: Not evaluated Extremities:  Without edema. Msk: Symmetrical without gross deformities. Peripheral pulses intact.  Neurologic:  Alert and  oriented, non verbal during this visit;  No focal deficits.  Skin:   Dry and intact without significant lesions or rashes. Psychiatric:  Cooperative.       Vladimir Crofts, PA-C 9:46 AM            Electronically signed by Vladimir Crofts, PA-C at 02/07/2022 10:57 AM Office Visit on 02/07/2022  Office Visit on 02/07/2022   Detailed Report  Note shared with patient Additional Documentation  Vitals:  BP 102/64 Pulse 88 Ht 4' 11"  (1.499 m) Wt 92 lb (41.7 kg) SpO2 96% BMI 18.58 kg/m BSA 1.32 m  More Vitals  Flowsheets:  Anthropometrics, NEWS, MEWS Score   Encounter Info:  Billing Info, History, Allergies, Detailed Report    Orders Placed   CBC with Differential/Platelet  IgA  Comprehensive metabolic panel  IBC + Ferritin  Sedimentation rate  Tissue transglutaminase, IgA  TSH  Cortisol  US Abdomen Complete All Encounter Results Medication Changes   None Medication List Visit Diagnoses   Unintentional weight loss  Renal insufficiency  Generalized abdominal pain  Anemia, unspecified type  Splenomegaly Problem List

## 2022-04-12 ENCOUNTER — Telehealth: Payer: Self-pay | Admitting: *Deleted

## 2022-04-12 NOTE — Telephone Encounter (Signed)
  Follow up Call-     04/11/2022    9:46 AM  Call back number  Post procedure Call Back phone  # [phone]  Permission to leave phone message Yes     Patient questions:  Do you have a fever, pain , or abdominal swelling? No. Pain Score  0 *  Have you tolerated food without any problems? Yes.    Have you been able to return to your normal activities? Yes.    Do you have any questions about your discharge instructions: Diet   No. Medications  No. Follow up visit  No.  Do you have questions or concerns about your Care? No.  Actions: * If pain score is 4 or above: No action needed, pain <4.

## 2022-05-19 ENCOUNTER — Other Ambulatory Visit: Payer: Self-pay | Admitting: Family Medicine

## 2022-07-13 LAB — PROINSULIN/INSULIN RATIO
Insulin: 4.6 u[IU]/mL
Proinsulin: 10.2 pmol/L — ABNORMAL HIGH

## 2022-07-30 ENCOUNTER — Telehealth: Payer: Self-pay

## 2022-07-30 NOTE — Telephone Encounter (Signed)
Mother calls nurse line requesting an apt with new PCP.   She reports she was reviewing her mychart today and saw some "high" values. Most concerning being Proinsulin/insulin ratio. She reports she was never contacted about high values back in April when labs were drawn.   She requests an apt this week.   I scheduled with new PCP on ATC for Wednesday.

## 2022-08-01 ENCOUNTER — Ambulatory Visit (INDEPENDENT_AMBULATORY_CARE_PROVIDER_SITE_OTHER): Payer: Medicare Other | Admitting: Student

## 2022-08-01 VITALS — BP 100/60 | Ht 59.0 in | Wt 85.2 lb

## 2022-08-01 DIAGNOSIS — G4700 Insomnia, unspecified: Secondary | ICD-10-CM | POA: Diagnosis not present

## 2022-08-01 DIAGNOSIS — Q909 Down syndrome, unspecified: Secondary | ICD-10-CM | POA: Diagnosis not present

## 2022-08-01 DIAGNOSIS — R634 Abnormal weight loss: Secondary | ICD-10-CM

## 2022-08-01 MED ORDER — CLONAZEPAM 0.5 MG PO TABS: 0.2500 mg | ORAL_TABLET | Freq: Every day | ORAL | 2 refills | Status: DC

## 2022-08-01 MED ORDER — GLUCERNA SHAKE PO LIQD: 237.0000 mL | Freq: Three times a day (TID) | ORAL | 2 refills | Status: DC

## 2022-08-01 MED ORDER — TRAZODONE HCL 50 MG PO TABS: ORAL_TABLET | ORAL | 1 refills | Status: DC

## 2022-08-01 NOTE — Assessment & Plan Note (Addendum)
Ongoing issue. Down another 7lbs this visit. Thankfully cancer workup has been negative and labwork has been quite reassuring to this point. There is mention of hypoglycemia in earlier notes, but the lowest she got was 80 which is fairly expected for someone on a carb-restricted diet as she was on. The marginally elevated proinsulin level was almost certainly just indicative of residual insulin resistance in this former diabetic. No evidence of insulinoma on CT A/P.  Down Syndrome-related dementia remains high on the differential, though mom denies any other behavior changes which would suggest this. She needs more calories. I would be fine with going back to Ensure, but given mom's hesitancy to increase carbohydrate load, I think going to Glucerna is reasonable. Will start at BID, but may ultimately need TID. Will also work with mom to get in with Dr. Gerilyn Pilgrim for formal nutrition evaluation.

## 2022-08-01 NOTE — Patient Instructions (Addendum)
Paloma,  Your weight today is down 7lbs from your last visit. Let's add some extra calories to address this. I sent some Glucerna to your pharmacy. Start with 2 shakes per day, but you may increase to 3. I also want you to see our nutritionist Dr. Gerilyn Pilgrim, you can call her at [phone] to make an appointment. I will be sure to let her know to expect your call.  [name], MD

## 2022-08-01 NOTE — Progress Notes (Addendum)
    SUBJECTIVE:   CHIEF COMPLAINT / HPI:   Unintentional Weight Loss F/u Patient seen by Dr. Salvadore Dom in April for unintentional weight loss. Thought to be primarily driven by Down Syndrome related dementia, though mom doubts this diagnosis as she has not had any behavior changes. Has had a fairly thorough workup including CT Abd/Pelv, Colonoscopy, and mammogram to evaluate for possible cancerous causes. All imaging to this point has been negative. Labwork, including evaluation for insulin-related pathologies, celiac disease, and adrenal abnormalities has been grossly normal.  On history review with mom, she notes that [facility] was diagnosed with diabetes in 2021. After that, mom cut out all processed foods and nearly all carbohydrates. It seems this was about the time that the weight loss started. Mom denies any other changes over the time period, including no decrease in Arlita's perceived food intake/portion sizes. At one point she tried to introduce Ensure but discontinued it due to concerns for the amount of sugar/total carbs in Ensure given Gennesis's history of diabetes. Mom has since liberalized her diet, adding in more carbohydrates than she had been getting early in the weight loss, though she continues to avoid processed foods. She would love to meet with a nutritionist to discuss the specifics of Khrista's nutritional requirements.    OBJECTIVE:   BP 100/60   Ht 4\' 11"  (1.499 m)   Wt 85 lb 3.2 oz (38.6 kg)   BMI 17.21 kg/m   Gen: Thin middle-aged female with Down Syndrome HENT: MMM Neck: Without adenopathy Cardio: RRR, without murmur, rub, or gallop Abd: Flat and soft   ASSESSMENT/PLAN:   Unintentional weight loss Ongoing issue. Down another 7lbs this visit. Thankfully cancer workup has been negative and labwork has been quite reassuring to this point. There is mention of hypoglycemia in earlier notes, but the lowest she got was 96 which is fairly expected for someone on a  carb-restricted diet as she was on. The marginally elevated proinsulin level was almost certainly just indicative of residual insulin resistance in this former diabetic. No evidence of insulinoma on CT A/P.  Down Syndrome-related dementia remains high on the differential, though mom denies any other behavior changes which would suggest this. She needs more calories. I would be fine with going back to Ensure, but given mom's hesitancy to increase carbohydrate load, I think going to Glucerna is reasonable. Will start at BID, but may ultimately need TID. Will also work with mom to get in with Dr. 76 for formal nutrition evaluation.     Gerilyn Pilgrim, MD [facility]

## 2022-08-02 ENCOUNTER — Telehealth: Payer: Self-pay

## 2022-08-02 DIAGNOSIS — Q909 Down syndrome, unspecified: Secondary | ICD-10-CM

## 2022-08-02 NOTE — Telephone Encounter (Signed)
Mother calls nurse line in regards to Clonazepam prescription.   She reports she spoke with the pharmacy this morning and the dose sent in was still .25mg  vs .5mg  they discussed at visit.  It appears .5mg  was sent in, however the .25mg  still remains on prescription.   Will forward to PCP to resend. Pharmacy is holding original prescription.

## 2022-08-03 MED ORDER — CLONAZEPAM 0.5 MG PO TABS: 0.5000 mg | ORAL_TABLET | Freq: Every day | ORAL | 2 refills | Status: DC

## 2022-08-03 NOTE — Telephone Encounter (Signed)
Mother has been updated.  

## 2022-08-13 ENCOUNTER — Other Ambulatory Visit: Payer: Self-pay | Admitting: Student

## 2022-08-13 DIAGNOSIS — R634 Abnormal weight loss: Secondary | ICD-10-CM

## 2022-08-16 ENCOUNTER — Ambulatory Visit (INDEPENDENT_AMBULATORY_CARE_PROVIDER_SITE_OTHER): Payer: Medicare Other | Admitting: Student

## 2022-08-16 VITALS — Ht 59.0 in | Wt 85.8 lb

## 2022-08-16 DIAGNOSIS — R634 Abnormal weight loss: Secondary | ICD-10-CM

## 2022-08-16 NOTE — Patient Instructions (Signed)
It was good to see you today! We set some goals to help [name], and they are listed below: 1. Eat at least 3 REAL meals and 1-2 snacks per day.   Eat breakfast within one hour of getting up.   Aim for no more than 5 hours between eating.   A REAL meal includes at least some protein, some starch, and vegetables and/or fruit.     2. Add some fats to each meals  Ex: (extra virgin olive oil - 1/2 tsp to vegetables, nut butter's, canola oil, sunflower seeds in yogurt/oatmeal)  Follow up Jan 22 @ 10 am

## 2022-08-16 NOTE — Progress Notes (Signed)
What do you want to get out of meeting with me today?  Information on nutrition/what to feed her.  Relevant history/background:  Patient has Down Syndrome, DM, HLD, and PCOS  Assessment:  On a normal day, how many meals do you have? 3 small meals.  How many snacks do you eat per day? 0-1 snack a day. What beverages do you drink most days of the week? H2O, juice.   What foods do you eat most days of the week? Vegetables, eggs, oatmeal, cheese, yogurt, no meat.   Usual physical activity: Not exercising. Sleep: Patient estimates average of 8 hours of sleep/night.  24-hr recall:  (Up at 5:30 AM); drank water B (6:30 am):  1 egg, 1 cup cooked oatmeal, 1/4 c applesauce, 1/2 c mixed vegetables (green beans, lentils, carrots, peas)   Snk ( AM)-   L (12:30 PM)-  1 cup mixed vegetables, 5 oz greek whole milk yogurt (skyr), H2O Snk ( 2 PM)- 1 slc Cheddar cheese toast D ( 4 PM)-  3/4 cup meat chili, over the 1-1.5 cup of vegetables, 1/2 cup apple sauce Snk ( PM)-   Typical day? Yes.     Food's she likes: Apple sauce, vegetables, oatmeal, yogurt  Intervention: Completed diet and exercise history, and established: - SMART behavioral goals (Specific, Measurable, Action-oriented, Realistic, Time-based.)    1. Eat at least 3 REAL meals and 1-2 snacks per day.  Eat breakfast within one hour of getting up.  Aim for no more than 5 hours between eating.   A REAL meal includes at least some protein, some starch, and vegetables and/or fruit.   (OR: Would you serve this to a guest in your home, and call it a meal?)   2. Add some fats (extra virgin olive oil - 1/2 tsp to vegetables, nut butters, canola oil, sunflower seeds in yogurt/oatmeal) to each meal    For recommendations and goals, see Patient Instructions.    Follow-up: Jan 22/2024 @ 10 am, w/ Dr. Cheryle Horsfall Courtez Twaddle

## 2022-08-22 ENCOUNTER — Ambulatory Visit (INDEPENDENT_AMBULATORY_CARE_PROVIDER_SITE_OTHER): Payer: Medicare Other | Admitting: Student

## 2022-08-22 ENCOUNTER — Other Ambulatory Visit: Payer: Self-pay

## 2022-08-22 VITALS — BP 96/50 | HR 72 | Wt 85.8 lb

## 2022-08-22 DIAGNOSIS — E162 Hypoglycemia, unspecified: Secondary | ICD-10-CM

## 2022-08-22 DIAGNOSIS — Z8639 Personal history of other endocrine, nutritional and metabolic disease: Secondary | ICD-10-CM

## 2022-08-22 DIAGNOSIS — R634 Abnormal weight loss: Secondary | ICD-10-CM

## 2022-08-22 LAB — POCT GLYCOSYLATED HEMOGLOBIN (HGB A1C): Hemoglobin A1C: 5.1 % (ref 4.0–5.6)

## 2022-08-22 LAB — GLUCOSE, POCT (MANUAL RESULT ENTRY): POC Glucose: 71 mg/dl (ref 70–99)

## 2022-08-22 MED ORDER — ACCU-CHEK GUIDE ME W/DEVICE KIT: 1.0000 | PACK | 0 refills | Status: AC | PRN

## 2022-08-22 MED ORDER — ACCU-CHEK SOFTCLIX LANCETS MISC: 12 refills | Status: AC

## 2022-08-22 MED ORDER — GLUCOSE BLOOD VI STRP: ORAL_STRIP | 12 refills | Status: AC

## 2022-08-22 NOTE — Progress Notes (Signed)
    SUBJECTIVE:   CHIEF COMPLAINT / HPI:   Unintentional Weight Loss Follow-up Presents accompanied by her mother/guardian. Seen by me a few weeks ago with unintentional weight loss through diet for several months.  Has had a fairly extensive workup to this point including a colonoscopy, mammogram to screen for occult cancers.  Has also been worked up for possible celiac disease or inflammatory bowel disease which could be contributing.  All of this has been negative.  Also with a endocrine workup that is pending grossly normal.  Has a history of diabetes and mother had been giving a low carb diet, though has since liberalized.  Saw nutrition last week with goal is to eat at least 3 real meals a day with 1-2 snacks in between.  Also with a goal of adding some healthy fats such as extra virgin olive oil and not butters to every meal.  At our last visit we had tried to add on some Glucerna but unfortunately Medicare would not pay for this so she has not been getting these. Mom also brings up some concern for some hypoglycemia in the past.  Per chart review the lowest I see is 89, though mom reports that that there were some lower readings that may not have been charted.  Mom has not been checking sugars at home but would like to to get an idea of where her sugars are over time and how she responds to foods.  I think this is reasonable.    OBJECTIVE:   BP (!) 96/50   Pulse 72   Wt 85 lb 12.8 oz (38.9 kg)   SpO2 100%   BMI 17.33 kg/m   General: at mental status baseline, non-verbal but tracks speakers during discussion, well groomed HEENT: normocephalic, atraumatic, EOM grossly intact, oral mucosa moist, neck supple Respiratory: normal respiratory effort GI: non-distended Skin: no rashes, no jaundice   ASSESSMENT/PLAN:   Unintentional weight loss Weight is up a few ounces. It seems we have at least arrested the weight loss, but would ideally like to see [place] moving in the other direction.   Given unaffordability of Glucerna and other shakes, encouraged mother to think about other less expensive, healthy high-calorie foods such as olive oils and not butters as dietary supplements.  These should be added to every meal.  Mom will work towards doing this is a way to increase her caloric intake. Given mom's concerns for her blood sugars, I think it is reasonable for her to keep a log at home.  Advised that she could check a fasting, preprandial and postprandial just to establish a trend.  If she gets grossly normal readings for several days, can discontinue monitoring.  Will have her back in 3 to 4 weeks to review glucose monitoring data.  Will also check an A1c today.  Random glucose today in clinic was 71. Per mom's report, she is maintaining her usual volume of intake which is reassuring, should her volume of intake began to wane, would have higher suspicion that Down syndrome associated dementia is a driving factor here.  For now my leading suspicion is that this is simply due to relatively low calories in her health-conscious diet.      Dana Gibbs, MD [facility]

## 2022-08-22 NOTE — Patient Instructions (Addendum)
Since it doesn't look like we'll be able to get Glucerna covered, I want you to start treating the healthy fat foods Dr. Gerilyn Pilgrim talked to you about as a "prescription." I want you to add olive oil or peanut butter to EVERY meal. This is a quick and easy (and very healthy) way to increase your calories.   Keep a log of your fasting morning glucose, your before lunch glucose, and your glucose 90 min after lunch. Bring this back with you when you see me in January.  [name], MD

## 2022-08-22 NOTE — Assessment & Plan Note (Signed)
Weight is up a few ounces. It seems we have at least arrested the weight loss, but would ideally like to see [place] moving in the other direction.  Given unaffordability of Glucerna and other shakes, encouraged mother to think about other less expensive, healthy high-calorie foods such as olive oils and not butters as dietary supplements.  These should be added to every meal.  Mom will work towards doing this is a way to increase her caloric intake. Given mom's concerns for her blood sugars, I think it is reasonable for her to keep a log at home.  Advised that she could check a fasting, preprandial and postprandial just to establish a trend.  If she gets grossly normal readings for several days, can discontinue monitoring.  Will have her back in 3 to 4 weeks to review glucose monitoring data.  Will also check an A1c today.  Random glucose today in clinic was 71. Per mom's report, she is maintaining her usual volume of intake which is reassuring, should her volume of intake began to wane, would have higher suspicion that Down syndrome associated dementia is a driving factor here.  For now my leading suspicion is that this is simply due to relatively low calories in her health-conscious diet.

## 2022-09-19 ENCOUNTER — Ambulatory Visit (INDEPENDENT_AMBULATORY_CARE_PROVIDER_SITE_OTHER): Payer: Medicare Other | Admitting: Student

## 2022-09-19 ENCOUNTER — Encounter: Payer: Self-pay | Admitting: Student

## 2022-09-19 VITALS — BP 111/77 | HR 88 | Wt 87.0 lb

## 2022-09-19 DIAGNOSIS — R634 Abnormal weight loss: Secondary | ICD-10-CM

## 2022-09-19 DIAGNOSIS — Q909 Down syndrome, unspecified: Secondary | ICD-10-CM

## 2022-09-19 NOTE — Patient Instructions (Addendum)
Congrats on the weight gain--I think we are moving in the right direction. Let's stop the glucose monitoring, I think we have worked out that there is not a concern here. Keep introducing the tasty, calorie-dense foods.  Bring all of your medicines to your visit on the 28th. We will also do your tetanus shot at that visit.   Flu shot next week at 10am on Wednesday.   Be on the lookout for Kearra faltering in her day-to-day routines as this is often the first sign of Down Syndrome associated-dementia.  Dana Dubonnet, MD

## 2022-09-19 NOTE — Progress Notes (Signed)
SUBJECTIVE:   CHIEF COMPLAINT / HPI:   Unintentional Weight Loss Ongoing issue.  First worked up by Dr. Janus Molder last summer with cancer screenings and laboratory workup largely nonfocal.  At our last visit, my leading suspicion was that her weight loss was due to inadequate caloric intake.  Her mother has since expanded her diet from a fairly restrictive diabetic diet to a more liberal, though still healthy, whole-food based diet. Weight is up 1lb 3.2oz since our last visit. Now that the diet is expanded--adding sweet potatoes, potatoes, chick peas, tuna-- she is showing more interest in eating.  Mom has been keeping a home glucose log with twice daily readings.  Review of the log is largely unremarkable.  She did have one low reading to 50.  Does not take any hypoglycemic agents.  Down Syndrome The patient's mother is her primary caregiver.  As part of the workup for her unintentional weight loss, Dr. Janus Molder and I have both raised some concern that this may be related to early Down syndrome-associated dementia (though lower concern now that she is improving with basic diet changes). Her mother, Ms Yaneth Fairbairn, is in generally good health and is able to meet all of Ghadeer's needs at this time.  However, we took this opportunity to have a discussion about the natural history of Down syndrome-associated dementia. We also discussed caring for a family member with dementia, especially that once folks are showing signs of dementia, they do less well with changes in care setting.  Ms. Munshi shares that if she were ever unable to care for [facility], that Bryli would go to live with her brother.  Ms Lookabaugh has not noticed any concerning behavior changes in [place]. She continues to perform her daily routines as well as she always has.   OBJECTIVE:   BP 111/77   Pulse 88   Wt 87 lb (39.5 kg)   SpO2 96%   BMI 17.57 kg/m   Physical Exam Vitals reviewed.  Constitutional:      Comments: Very  thin 52 year old female with Down Syndrome  Cardiovascular:     Rate and Rhythm: Normal rate and regular rhythm.  Pulmonary:     Effort: Pulmonary effort is normal. No respiratory distress.     Breath sounds: No wheezing or rales.  Abdominal:     General: Abdomen is flat. There is no distension.     Palpations: Abdomen is soft.  Musculoskeletal:        General: No swelling or deformity.  Skin:    General: Skin is warm and dry.  Neurological:     Comments: At baseline, participates in physical exam       ASSESSMENT/PLAN:   Unintentional weight loss Weight is up 1lb 3.2oz. Showing more interest in eating with the introduction of more calorie dense options.  I am encouraged that it seems we have reversed the trend of her weight loss.  Would like to see her gained some more weight before we stopped having these frequent visits though. I am reassured by her normal glucose log.  I think it is fair to say that she has reversed her type 2 diabetes.  The isolated low glucose may represent some reactive hypoglycemia as she continues to recover from her insulin resistant state. Regardless, I am not concerned about this finding in isolation. The remainder of her glucometer readings are appropriate for someone on a whole-food, generally low-glycemic diet.  - Return in 6 wk for  [place] clinic, patient's mother is aware of her need to bring in all medications  Down syndrome Doing well. Long discussion today about the natural history of Down Syndrome-associated dementia. Little reason to suspect that this is a factor at this time based on mom's history, but I'm glad we are getting the ball rolling on advanced care planning and mom has a plan for if/when she is unable to meet Antoinett's care needs.      Pearla Dubonnet, MD [place]

## 2022-09-20 NOTE — Assessment & Plan Note (Addendum)
Doing well. Long discussion today about the natural history of Down Syndrome-associated dementia. Little reason to suspect that this is a factor at this time based on mom's history, but I'm glad we are getting the ball rolling on advanced care planning and mom has a plan for if/when she is unable to meet Melvina's care needs.

## 2022-09-20 NOTE — Assessment & Plan Note (Signed)
Weight is up 1lb 3.2oz. Showing more interest in eating with the introduction of more calorie dense options.  I am encouraged that it seems we have reversed the trend of her weight loss.  Would like to see her gained some more weight before we stopped having these frequent visits though. I am reassured by her normal glucose log.  I think it is fair to say that she has reversed her type 2 diabetes.  The isolated low glucose may represent some reactive hypoglycemia as she continues to recover from her insulin resistant state. Regardless, I am not concerned about this finding in isolation. The remainder of her glucometer readings are appropriate for someone on a whole-food, generally low-glycemic diet.  - Return in 6 wk for [facility] clinic, patient's mother is aware of her need to bring in all medications

## 2022-09-24 ENCOUNTER — Ambulatory Visit: Payer: Medicare Other | Admitting: Family Medicine

## 2022-09-26 ENCOUNTER — Ambulatory Visit (INDEPENDENT_AMBULATORY_CARE_PROVIDER_SITE_OTHER): Payer: Medicare Other

## 2022-09-26 DIAGNOSIS — Z23 Encounter for immunization: Secondary | ICD-10-CM

## 2022-10-31 ENCOUNTER — Encounter: Payer: Self-pay | Admitting: Student

## 2022-10-31 ENCOUNTER — Ambulatory Visit (INDEPENDENT_AMBULATORY_CARE_PROVIDER_SITE_OTHER): Payer: Medicare Other | Admitting: Student

## 2022-10-31 VITALS — BP 108/66 | HR 63 | Wt 86.0 lb

## 2022-10-31 DIAGNOSIS — Q909 Down syndrome, unspecified: Secondary | ICD-10-CM

## 2022-10-31 DIAGNOSIS — F0280 Dementia in other diseases classified elsewhere without behavioral disturbance: Secondary | ICD-10-CM

## 2022-10-31 NOTE — Patient Instructions (Addendum)
Muriah,   Always great to see you! I'm so sorry about your bellyache. I am glad that your mom is working to prepare herself to be the best caretaker that she can be.  There are several resources available online. The NDSS (National Down Syndrome Society) has an entire section of the website dedicated to Alzheimer's Disease in Down Syndrome. The have a book available called the "Alzheimer's Disease & Down Syndrome: A Practical Guidebook for Caregivers" that you can order. www.ndss.org  The Alzheimer's Association also has a section on their website specifically for Down Syndrome. [employer]Mile.co.nz  I am always happy to see you in clinic.   Marnee Guarneri, MD

## 2022-10-31 NOTE — Progress Notes (Unsigned)
     SUBJECTIVE:   CHIEF COMPLAINT / HPI:   Down Syndrome  Unintentional Weight Loss  Extensive workup for the past year, including imaging, endocrine labs, and cancer screening has been unremarkable. At this time it seems most likely symptoms are the harbinger of Down Syndrome-Associated Dementia. Mom brings her in today to discuss this. Weight has stabilized around 86lbs with some diet changes toward more calorie-dense food options. Thankfully it seems [name]'s appetite seems to be about as strong as it always has been. No other behavior changes. -- though mom does note [name] said she had a bit of a bellyache earlier today. Ate a full/normal breakfast and has had no changes in stool.  Long, wide-ranging conversation today around dementia, Down Syndrome, and the unpredictable time course of this disease. At this time, Down Syndrome-associated Alzheimer's disease is her presumptive diagnosis. Long discussion with mom about the risks/benefits of neuroimaging at this time. Joint decision made not to pursue at this time given it would not change care plan. Patient's mother does have some experience with patient's with dementia given that her own mother had the condition toward the end of life, though much of the care fell to [name]'s aunt since her mother was engaged with [name]'s care at the time.  Mom with limited community resources as she has always been the caregiver. Thankfully her son does live nearby and is able to be involved in [name]'s care both now and in the future.    OBJECTIVE:   BP 108/66   Pulse 63   Wt 86 lb (39 kg)   LMP 10/23/2021 (Approximate)   SpO2 98%   BMI 17.37 kg/m   Physical Exam Constitutional:      Comments: Greets interviewer, otherwise minimally engaged in interview, though orients to speakers. NAD.   Cardiovascular:     Rate and Rhythm: Normal rate and regular rhythm.     Heart sounds: No murmur heard. Pulmonary:     Effort: No respiratory distress.  Abdominal:      General: Abdomen is flat. There is no distension.     Tenderness: There is no abdominal tenderness.  Skin:    General: Skin is warm.  Psychiatric:        Behavior: Behavior normal.     ASSESSMENT/PLAN:   Dementia due to Down syndrome ([name]) Thankfully it seems mom has more-or-less stabilized the weight loss with the interventions she has made. Will try to equip mom with as many resources as possible for the journey of walking with a loved one with Dementia. Family care literature provided from clinic today and also pointed mom to resources available from both the [place] and Alzheimer's Association that deal specifically with patient's with Down Syndrome.  - Mother prefers close interval follow-up, will see back in four-five weeks      J Pearla Dubonnet, MD [place]

## 2022-11-01 NOTE — Assessment & Plan Note (Addendum)
Thankfully it seems mom has more-or-less stabilized the weight loss with the interventions she has made. Will try to equip mom with as many resources as possible for the journey of walking with a loved one with Dementia. Family care literature provided from clinic today and also pointed mom to resources available from both the [place] and Alzheimer's Association that deal specifically with patient's with Down Syndrome.  - Mother prefers close interval follow-up, will see back in four-five weeks

## 2022-12-03 ENCOUNTER — Ambulatory Visit: Payer: Medicare Other | Admitting: Student

## 2022-12-03 ENCOUNTER — Encounter: Payer: Self-pay | Admitting: Student

## 2022-12-03 VITALS — Wt 85.6 lb

## 2022-12-03 DIAGNOSIS — F0280 Dementia in other diseases classified elsewhere without behavioral disturbance: Secondary | ICD-10-CM

## 2022-12-03 DIAGNOSIS — Q909 Down syndrome, unspecified: Secondary | ICD-10-CM

## 2022-12-03 NOTE — Patient Instructions (Signed)
I'm going to call your weight stable. Lets keep looking for ways to increase calorie density in your food, oils and nuts are good options. If she likes eggs with cheese, that's a great option too. Let's see you back in June. We'll keep an eye on your behavior for other evidence of dementia.   Marnee Guarneri, MD

## 2022-12-04 NOTE — Progress Notes (Signed)
    SUBJECTIVE:   CHIEF COMPLAINT / HPI:   Down Syndrome Associated Dementia Patient here with mother to check in. Notes weight stable from previous visits. Have been trying to increase caloric density of meals to stave off weight loss. Mom has started sauteeing veggies in oil as a healthy way to get more fats in the diet. No new behavior changes or confusion. At last visit we gave mom lots of resources for family members of patients with dementia. She has been working her way through these.    OBJECTIVE:   Wt 85 lb 9.6 oz (38.8 kg)   LMP 10/23/2021 (Approximate)   BMI 17.29 kg/m   Gen: Alert, at mental status baseline, tracks but doesn't speak HENT: MMM, EOMs intact, sclerae anicteric Resp: Normal WOB on RA Ext: Without edema or deformity   ASSESSMENT/PLAN:   Dementia due to Down syndrome (HCC) Unchanged compared to my previous exams. Mom is working her way through the literature we provided last visit. Weight is stable. Will bring back in 2-3 months for follow-up.      Marnee Guarneri, MD [place]

## 2022-12-04 NOTE — Assessment & Plan Note (Signed)
Unchanged compared to my previous exams. Mom is working her way through the literature we provided last visit. Weight is stable. Will bring back in 2-3 months for follow-up.

## 2022-12-08 ENCOUNTER — Other Ambulatory Visit: Payer: Self-pay | Admitting: Student

## 2022-12-08 DIAGNOSIS — Q909 Down syndrome, unspecified: Secondary | ICD-10-CM

## 2022-12-18 ENCOUNTER — Telehealth: Payer: Self-pay

## 2022-12-18 NOTE — Telephone Encounter (Signed)
Patients mother calls nurse line is regards to DSS form completion.   She reports she spoke with PCP about this already, however would like to speak with him before he fills the form out.   She reports she will no longer be bringing the form to [place] and reports the form will now be faxed to our office.   Please call mother at [phone] before completing form.   Will forward to PCP.

## 2023-01-10 ENCOUNTER — Telehealth: Payer: Self-pay | Admitting: *Deleted

## 2023-01-10 NOTE — Telephone Encounter (Signed)
Mom calls inquiring if FL2 has been filled out yet?  Jone Baseman, CMA

## 2023-01-14 NOTE — Telephone Encounter (Signed)
Mother returns call to nurse line.   Advised that this was faxed on Friday, 5/10. Mother states that social worker said they checked this morning and they never received anything.   Mother requesting that we re fax paperwork. Faxed to provided number.   She is also requesting a copy be placed in the mail. Copy placed in outgoing mail.   Veronda Prude, RN

## 2023-02-07 ENCOUNTER — Other Ambulatory Visit: Payer: Self-pay | Admitting: Student

## 2023-02-07 DIAGNOSIS — G4700 Insomnia, unspecified: Secondary | ICD-10-CM

## 2023-02-07 DIAGNOSIS — Q909 Down syndrome, unspecified: Secondary | ICD-10-CM

## 2023-02-11 ENCOUNTER — Ambulatory Visit (INDEPENDENT_AMBULATORY_CARE_PROVIDER_SITE_OTHER): Payer: Medicare Other

## 2023-02-11 VITALS — Ht 59.0 in | Wt 85.0 lb

## 2023-02-11 DIAGNOSIS — Z0000 Encounter for general adult medical examination without abnormal findings: Secondary | ICD-10-CM | POA: Diagnosis not present

## 2023-02-11 NOTE — Progress Notes (Cosign Needed Addendum)
Subjective:   [name] is a 52 y.o. female who presents for an Initial Medicare Annual Wellness Visit.  I connected with  [name] on 02/11/23 by a audio enabled telemedicine application and verified that I am speaking with the correct person using two identifiers.  Patient Location: Home  Provider Location: Home Office  I discussed the limitations of evaluation and management by telemedicine. The patient expressed understanding and agreed to proceed.  Review of Systems     Cardiac Risk Factors include: sedentary lifestyle     Objective:    Today's Vitals   02/11/23 1417  Weight: 85 lb (38.6 kg)  Height: 4\' 11"  (1.499 m)   Body mass index is 17.17 kg/m.     02/11/2023    2:21 PM 10/31/2022    9:59 AM 08/22/2022    9:54 AM 08/01/2022   10:02 AM 12/26/2021    1:50 PM 02/27/2021    4:06 PM 03/03/2019    3:32 PM  Advanced Directives  Does Patient Have a Medical Advance Directive? No No No No No No No  Would patient like information on creating a medical advance directive? Yes (MAU/Ambulatory/Procedural Areas - Information given) No - Patient declined No - Patient declined  No - Patient declined No - Patient declined No - Patient declined    Current Medications (verified) Outpatient Encounter Medications as of 02/11/2023  Medication Sig   Accu-Chek Softclix Lancets lancets Use as instructed   Blood Glucose Monitoring Suppl (ACCU-CHEK GUIDE ME) w/Device KIT 1 kit by Does not apply route as needed.   clonazePAM (KLONOPIN) 0.5 MG tablet TAKE 1 TABLET(0.5 MG) BY MOUTH DAILY   glucose blood (ACCU-CHEK AVIVA) test strip Use to check sugar daily as needed.   traZODone (DESYREL) 50 MG tablet TAKE 1/2 TO 1 TABLET BY MOUTH AT BEDTIME AS NEEDED FOR INSOMNIA   No facility-administered encounter medications on file as of 02/11/2023.    Allergies (verified) Patient has no known allergies.   History: Past Medical History:  Diagnosis Date   Down syndrome     Hyperlipidemia    Mood disorder (HCC)    Obesity    PCOS (polycystic ovarian syndrome)    Past Surgical History:  Procedure Laterality Date   CYSTOSCOPY     Family History  Problem Relation Age of Onset   Colon cancer Neg Hx    Esophageal cancer Neg Hx    Stomach cancer Neg Hx    Rectal cancer Neg Hx    Social History   Socioeconomic History   Marital status: Single    Spouse name: Not on file   Number of children: Not on file   Years of education: Not on file   Highest education level: Not on file  Occupational History   Occupation: disable  Tobacco Use   Smoking status: Never    Passive exposure: Never   Smokeless tobacco: Never  Vaping Use   Vaping Use: Never used  Substance and Sexual Activity   Alcohol use: No   Drug use: No   Sexual activity: Never  Other Topics Concern   Not on file  Social History Narrative   Not on file   Social Determinants of Health   Financial Resource Strain: Low Risk  (02/11/2023)   Overall Financial Resource Strain (CARDIA)    Difficulty of Paying Living Expenses: Not hard at all  Food Insecurity: No Food Insecurity (02/11/2023)   Hunger Vital Sign    Worried About Running Out  of Food in the Last Year: Never true    Ran Out of Food in the Last Year: Never true  Transportation Needs: No Transportation Needs (02/11/2023)   PRAPARE - Administrator, Civil Service (Medical): No    Lack of Transportation (Non-Medical): No  Physical Activity: Inactive (02/11/2023)   Exercise Vital Sign    Days of Exercise per Week: 0 days    Minutes of Exercise per Session: 0 min  Stress: No Stress Concern Present (02/11/2023)   [employer] of Occupational Health - Occupational Stress Questionnaire    Feeling of Stress : Not at all  Social Connections: Moderately Isolated (02/11/2023)   Social Connection and Isolation Panel [NHANES]    Frequency of Communication with Friends and Family: Once a week    Frequency of Social Gatherings  with Friends and Family: Three times a week    Attends Religious Services: More than 4 times per year    Active Member of Clubs or Organizations: No    Attends Banker Meetings: Never    Marital Status: Never married    Tobacco Counseling Counseling given: Not Answered   Clinical Intake:  Pre-visit preparation completed: Yes  Pain : No/denies pain  Diabetes: No  How often do you need to have someone help you when you read instructions, pamphlets, or other written materials from your doctor or pharmacy?: 1 - Never  Diabetic?No   Interpreter Needed?: No  Comments: Assisted with visit by mother-[name] Information entered by :: Kandis Fantasia LPN   Activities of Daily Living    02/11/2023    2:20 PM  In your present state of health, do you have any difficulty performing the following activities:  Hearing? 0  Vision? 0  Difficulty concentrating or making decisions? 1  Walking or climbing stairs? 0  Dressing or bathing? 0  Doing errands, shopping? 1  Preparing Food and eating ? N  Using the Toilet? N  In the past six months, have you accidently leaked urine? N  Do you have problems with loss of bowel control? N  Managing your Medications? Y  Managing your Finances? Y  Housekeeping or managing your Housekeeping? Y    Patient Care Team: Alicia Amel, MD as PCP - General (Family Medicine)  Indicate any recent Medical Services you may have received from other than [facility] providers in the past year (date may be approximate).     Assessment:   This is a routine wellness examination for [facility].  Hearing/Vision screen Hearing Screening - Comments:: Denies hearing difficulties   Vision Screening - Comments:: No vision problems; will schedule routine eye exam soon    Dietary issues and exercise activities discussed: Current Exercise Habits: The patient does not participate in regular exercise at present   Goals Addressed             This Visit's  Progress    Maintain weight and prevent further loss         Depression Screen    02/11/2023    2:20 PM 10/31/2022   10:08 AM 09/19/2022   10:35 AM 12/26/2021    1:51 PM 11/07/2021   11:30 AM 02/27/2021    4:07 PM 03/03/2019    3:31 PM  PHQ 2/9 Scores  PHQ - 2 Score 0 0 0 0 0 0 0  PHQ- 9 Score  0 0 0 5 0     Fall Risk    02/11/2023    2:20 PM 09/19/2022  10:03 AM 08/22/2022    9:53 AM 03/03/2019    3:31 PM 02/27/2018    9:11 AM  Fall Risk   Falls in the past year? 0 0 0 0 No  Number falls in past yr: 0 0 0 0   Injury with Fall? 0 0 0    Risk for fall due to : No Fall Risks      Follow up Falls prevention discussed;Education provided;Falls evaluation completed        FALL RISK PREVENTION PERTAINING TO THE HOME:  Any stairs in or around the home? No  If so, are there any without handrails? No  Home free of loose throw rugs in walkways, pet beds, electrical cords, etc? Yes  Adequate lighting in your home to reduce risk of falls? Yes   ASSISTIVE DEVICES UTILIZED TO PREVENT FALLS:  Life alert? No  Use of a cane, walker or w/c? No  Grab bars in the bathroom? Yes  Shower chair or bench in shower? No  Elevated toilet seat or a handicapped toilet? Yes   TIMED UP AND GO:  Was the test performed? No . Telephonic visit   Cognitive Function: current diagnosis of dementia         02/11/2023    2:21 PM  6CIT Screen  What Year? 0 points  What month? 0 points  What time? 0 points  Count back from 20 0 points  Months in reverse 4 points  Repeat phrase 10 points  Total Score 14 points    Immunizations Immunization History  Administered Date(s) Administered   Influenza,inj,Quad PF,6+ Mos 09/26/2022   PFIZER(Purple Top)SARS-COV-2 Vaccination 07/02/2020    TDAP status: Due, Education has been provided regarding the importance of this vaccine. Advised may receive this vaccine at local pharmacy or Health Dept. Aware to provide a copy of the vaccination record if obtained from  local pharmacy or Health Dept. Verbalized acceptance and understanding.  Pneumococcal vaccine status: Up to date  Covid-19 vaccine status: Information provided on how to obtain vaccines.   Qualifies for Shingles Vaccine? Yes   Zostavax completed No   Shingrix Completed?: No.    Education has been provided regarding the importance of this vaccine. Patient has been advised to call insurance company to determine out of pocket expense if they have not yet received this vaccine. Advised may also receive vaccine at local pharmacy or Health Dept. Verbalized acceptance and understanding.  Screening Tests Health Maintenance  Topic Date Due   DTaP/Tdap/Td (1 - Tdap) Never done   Zoster Vaccines- Shingrix (1 of 2) Never done   PAP SMEAR-Modifier  11/29/2013   COVID-19 Vaccine (2 - Pfizer risk series) 07/23/2020   INFLUENZA VACCINE  04/04/2023   MAMMOGRAM  02/02/2024   Medicare Annual Wellness (AWV)  02/11/2024   Colonoscopy  04/11/2032   Hepatitis C Screening  Completed   HIV Screening  Completed   HPV VACCINES  Aged Out    Health Maintenance  Health Maintenance Due  Topic Date Due   DTaP/Tdap/Td (1 - Tdap) Never done   Zoster Vaccines- Shingrix (1 of 2) Never done   PAP SMEAR-Modifier  11/29/2013   COVID-19 Vaccine (2 - Pfizer risk series) 07/23/2020    Colorectal cancer screening: Type of screening: Colonoscopy. Completed 04/11/22. Repeat every 10 years  Mammogram status: Completed 02/01/22. Repeat every year  Lung Cancer Screening: (Low Dose CT Chest recommended if Age 53-80 years, 30 pack-year currently smoking OR have quit w/in 15years.) does not qualify.  Lung Cancer Screening Referral: n/a  Additional Screening:  Hepatitis C Screening: does qualify; Completed 03/02/20  Vision Screening: Recommended annual ophthalmology exams for early detection of glaucoma and other disorders of the eye. Is the patient up to date with their annual eye exam?  No  Who is the provider or what  is the name of the office in which the patient attends annual eye exams? none If pt is not established with a provider, would they like to be referred to a provider to establish care? No .   Dental Screening: Recommended annual dental exams for proper oral hygiene  Community Resource Referral / Chronic Care Management: CRR required this visit?  No   CCM required this visit?  No      Plan:     I have personally reviewed and noted the following in the patient's chart:   Medical and social history Use of alcohol, tobacco or illicit drugs  Current medications and supplements including opioid prescriptions. Patient is not currently taking opioid prescriptions. Functional ability and status Nutritional status Physical activity Advanced directives List of other physicians Hospitalizations, surgeries, and ER visits in previous 12 months Vitals Screenings to include cognitive, depression, and falls Referrals and appointments  In addition, I have reviewed and discussed with patient certain preventive protocols, quality metrics, and best practice recommendations. A written personalized care plan for preventive services as well as general preventive health recommendations were provided to patient.     Durwin Nora, [place]   02/05/5408   Due to this being a virtual visit, the after visit summary with patients personalized plan was offered to patient via mail or my-chart. per request, patient was mailed a copy of AVS  Nurse Notes: Mother is asking for an rx for a seat cushion and possible some undergarments that have padding to prevent pressure ulcers.    I have reviewed this visit and agree with the documentation.  Marshall L Chambliss

## 2023-02-11 NOTE — Patient Instructions (Addendum)
[name] , Thank you for taking time to come for your Medicare Wellness Visit. I appreciate your ongoing commitment to your health goals. Please review the following plan we discussed and let me know if I can assist you in the future.   These are the goals we discussed:  Goals      Maintain weight and prevent further loss        This is a list of the screening recommended for you and due dates:  Health Maintenance  Topic Date Due   DTaP/Tdap/Td vaccine (1 - Tdap) Never done   Zoster (Shingles) Vaccine (1 of 2) Never done   Pap Smear  11/29/2013   COVID-19 Vaccine (2 - Pfizer risk series) 07/23/2020   Flu Shot  04/04/2023   Mammogram  02/02/2024   Medicare Annual Wellness Visit  02/11/2024   Colon Cancer Screening  04/11/2032   Hepatitis C Screening  Completed   HIV Screening  Completed   HPV Vaccine  Aged Out    Advanced directives: Information on Advanced Care Planning can be found at [facility] Advance Health Care Directives Advance Health Care Directives ([email or web address] Please bring a copy of your health care power of attorney and living will to the office to be added to your chart at your convenience.   Conditions/risks identified: Aim for 30 minutes of exercise or brisk walking, 6-8 glasses of water, and 5 servings of fruits and vegetables each day.   Next appointment: Follow up in one year for your annual wellness visit.   Preventive Care 40-64 Years, Female Preventive care refers to lifestyle choices and visits with your health care provider that can promote health and wellness. What does preventive care include? A yearly physical exam. This is also called an annual well check. Dental exams once or twice a year. Routine eye exams. Ask your health care provider how often you should have your eyes checked. Personal lifestyle choices, including: Daily care of your teeth and gums. Regular physical activity. Eating a healthy diet. Avoiding tobacco and  drug use. Limiting alcohol use. Practicing safe sex. Taking low-dose aspirin daily starting at age 63. Taking vitamin and mineral supplements as recommended by your health care provider. What happens during an annual well check? The services and screenings done by your health care provider during your annual well check will depend on your age, overall health, lifestyle risk factors, and family history of disease. Counseling  Your health care provider may ask you questions about your: Alcohol use. Tobacco use. Drug use. Emotional well-being. Home and relationship well-being. Sexual activity. Eating habits. Work and work Astron[employer]. Method of birth control. Menstrual cycle. Pregnancy history. Screening  You may have the following tests or measurements: Height, weight, and BMI. Blood pressure. Lipid and cholesterol levels. These may be checked every 5 years, or more frequently if you are over 37 years old. Skin check. Lung cancer screening. You may have this screening every year starting at age 71 if you have a 30-pack-year history of smoking and currently smoke or have quit within the past 15 years. Fecal occult blood test (FOBT) of the stool. You may have this test every year starting at age 72. Flexible sigmoidoscopy or colonoscopy. You may have a sigmoidoscopy every 5 years or a colonoscopy every 10 years starting at age 41. Hepatitis C blood test. Hepatitis B blood test. Sexually transmitted disease (STD) testing. Diabetes screening. This is done by checking your blood sugar (glucose) after you have not  eaten for a while (fasting). You may have this done every 1-3 years. Mammogram. This may be done every 1-2 years. Talk to your health care provider about when you should start having regular mammograms. This may depend on whether you have a family history of breast cancer. BRCA-related cancer screening. This may be done if you have a family history of breast, ovarian, tubal, or  peritoneal cancers. Pelvic exam and Pap test. This may be done every 3 years starting at age 10. Starting at age 55, this may be done every 5 years if you have a Pap test in combination with an HPV test. Bone density scan. This is done to screen for osteoporosis. You may have this scan if you are at high risk for osteoporosis. Discuss your test results, treatment options, and if necessary, the need for more tests with your health care provider. Vaccines  Your health care provider may recommend certain vaccines, such as: Influenza vaccine. This is recommended every year. Tetanus, diphtheria, and acellular pertussis (Tdap, Td) vaccine. You may need a Td booster every 10 years. Zoster vaccine. You may need this after age 39. Pneumococcal 13-valent conjugate (PCV13) vaccine. You may need this if you have certain conditions and were not previously vaccinated. Pneumococcal polysaccharide (PPSV23) vaccine. You may need one or two doses if you smoke cigarettes or if you have certain conditions. Talk to your health care provider about which screenings and vaccines you need and how often you need them. This information is not intended to replace advice given to you by your health care provider. Make sure you discuss any questions you have with your health care provider. Document Released: 09/16/2015 Document Revised: 05/09/2016 Document Reviewed: 06/21/2015 Elsevier Interactive Patient Education  2017 [place] Prevention in the Home Falls can cause injuries. They can happen to people of all ages. There are many things you can do to make your home safe and to help prevent falls. What can I do on the outside of my home? Regularly fix the edges of walkways and driveways and fix any cracks. Remove anything that might make you trip as you walk through a door, such as a raised step or threshold. Trim any bushes or trees on the path to your home. Use bright outdoor lighting. Clear any walking  paths of anything that might make someone trip, such as rocks or tools. Regularly check to see if handrails are loose or broken. Make sure that both sides of any steps have handrails. Any raised decks and porches should have guardrails on the edges. Have any leaves, snow, or ice cleared regularly. Use sand or salt on walking paths during winter. Clean up any spills in your garage right away. This includes oil or grease spills. What can I do in the bathroom? Use night lights. Install grab bars by the toilet and in the tub and shower. Do not use towel bars as grab bars. Use non-skid mats or decals in the tub or shower. If you need to sit down in the shower, use a plastic, non-slip stool. Keep the floor dry. Clean up any water that spills on the floor as soon as it happens. Remove soap buildup in the tub or shower regularly. Attach bath mats securely with double-sided non-slip rug tape. Do not have throw rugs and other things on the floor that can make you trip. What can I do in the bedroom? Use night lights. Make sure that you have a light by your bed  that is easy to reach. Do not use any sheets or blankets that are too big for your bed. They should not hang down onto the floor. Have a firm chair that has side arms. You can use this for support while you get dressed. Do not have throw rugs and other things on the floor that can make you trip. What can I do in the kitchen? Clean up any spills right away. Avoid walking on wet floors. Keep items that you use a lot in easy-to-reach places. If you need to reach something above you, use a strong step stool that has a grab bar. Keep electrical cords out of the way. Do not use floor polish or wax that makes floors slippery. If you must use wax, use non-skid floor wax. Do not have throw rugs and other things on the floor that can make you trip. What can I do with my stairs? Do not leave any items on the stairs. Make sure that there are handrails  on both sides of the stairs and use them. Fix handrails that are broken or loose. Make sure that handrails are as long as the stairways. Check any carpeting to make sure that it is firmly attached to the stairs. Fix any carpet that is loose or worn. Avoid having throw rugs at the top or bottom of the stairs. If you do have throw rugs, attach them to the floor with carpet tape. Make sure that you have a light switch at the top of the stairs and the bottom of the stairs. If you do not have them, ask someone to add them for you. What else can I do to help prevent falls? Wear shoes that: Do not have high heels. Have rubber bottoms. Are comfortable and fit you well. Are closed at the toe. Do not wear sandals. If you use a stepladder: Make sure that it is fully opened. Do not climb a closed stepladder. Make sure that both sides of the stepladder are locked into place. Ask someone to hold it for you, if possible. Clearly mark and make sure that you can see: Any grab bars or handrails. First and last steps. Where the edge of each step is. Use tools that help you move around (mobility aids) if they are needed. These include: Canes. Walkers. Scooters. Crutches. Turn on the lights when you go into a dark area. Replace any light bulbs as soon as they burn out. Set up your furniture so you have a clear path. Avoid moving your furniture around. If any of your floors are uneven, fix them. If there are any pets around you, be aware of where they are. Review your medicines with your doctor. Some medicines can make you feel dizzy. This can increase your chance of falling. Ask your doctor what other things that you can do to help prevent falls. This information is not intended to replace advice given to you by your health care provider. Make sure you discuss any questions you have with your health care provider. Document Released: 06/16/2009 Document Revised: 01/26/2016 Document Reviewed:  09/24/2014 Elsevier Interactive Patient Education  2017 [employer].

## 2023-02-15 ENCOUNTER — Telehealth: Payer: Self-pay

## 2023-02-15 ENCOUNTER — Ambulatory Visit (INDEPENDENT_AMBULATORY_CARE_PROVIDER_SITE_OTHER): Payer: Medicare Other | Admitting: Student

## 2023-02-15 ENCOUNTER — Encounter: Payer: Self-pay | Admitting: Student

## 2023-02-15 VITALS — BP 110/60 | HR 64 | Ht 59.0 in | Wt 84.6 lb

## 2023-02-15 DIAGNOSIS — L89301 Pressure ulcer of unspecified buttock, stage 1: Secondary | ICD-10-CM | POA: Diagnosis not present

## 2023-02-15 DIAGNOSIS — R636 Underweight: Secondary | ICD-10-CM

## 2023-02-15 DIAGNOSIS — Q909 Down syndrome, unspecified: Secondary | ICD-10-CM

## 2023-02-15 DIAGNOSIS — E441 Mild protein-calorie malnutrition: Secondary | ICD-10-CM | POA: Diagnosis not present

## 2023-02-15 DIAGNOSIS — F0280 Dementia in other diseases classified elsewhere without behavioral disturbance: Secondary | ICD-10-CM

## 2023-02-15 MED ORDER — ADULT MULTIVITAMIN LIQUID CH: 5.0000 mL | Freq: Every day | ORAL | 1 refills | Status: AC

## 2023-02-15 NOTE — Telephone Encounter (Signed)
DME order for Roho cushion sent to [employer] for processing.

## 2023-02-15 NOTE — Patient Instructions (Addendum)
Let's try to introduce some liquid multivitamins. Any store brand would be fine or if you prefer name brand, [employer] sells a Centrum liquid. I will look into getting a cushion for you to prevent any pressure wounds, though the very best thing to avoid wounds is staying mobile.  I am ordering a Roho cushion. Our office will try to get this approved by your insurance for your stage 1 injury. If we are not successful, you may need to get this on Amazon. (I recommend going to one of the <$100.   Eliezer Mccoy, MD

## 2023-02-15 NOTE — Progress Notes (Signed)
    SUBJECTIVE:   CHIEF COMPLAINT / HPI:   Dementia due to Down Syndrome Dementia-Associated Weight Loss Here for follow-up.  Since our last visit has been doing quite well. Has added some chocolate milk. Cooking with oils as well. Not taking any formal supplements at this time (Ensure, Breeze, etc.) due to cost.   Request for [employer] Mom is concerned for risk of pressure ulcers. She is markedly underweight, and has very little muscle/fat over her sacrum/ischium. Occasionally will get tenderness over ischial spine; thankfully has never had skin breakdown there beyond redness.      OBJECTIVE:   BP 110/60   Pulse 64   Ht 4\' 11"  (1.499 m)   Wt 84 lb 9.6 oz (38.4 kg)   LMP 10/23/2021 (Approximate)   BMI 17.09 kg/m   General: alert & oriented, no apparent distress, well groomed HEENT: normocephalic, atraumatic, EOM grossly intact, oral mucosa moist, neck supple Respiratory: normal respiratory effort GI: non-distended Skin: Without breakdown, however ++ tenderness over sacrum and ischium Psych: appropriate mood and affect   ASSESSMENT/PLAN:   Dementia due to Down syndrome (HCC) Stable. Weight loss seems to have plateaued.  Will continue with current interventions, should she start losing weight again, she would need to add a daily supplement. -Will add a daily multivitamin, she prefers liquid formulations  Pressure injury of buttock, stage 1 Her mother's concerns for skin breakdown are reasonable given her low BMI and not nutrition associated with dementia from her Down syndrome. -I have ordered a Roho pad for her stage I pressure injury -Adding a multivitamin as above -Advised frequent repositioning and offloading of the area as able     J [name], MD [facility]

## 2023-02-16 DIAGNOSIS — L89301 Pressure ulcer of unspecified buttock, stage 1: Secondary | ICD-10-CM | POA: Insufficient documentation

## 2023-02-16 NOTE — Assessment & Plan Note (Signed)
Her mother's concerns for skin breakdown are reasonable given her low BMI and not nutrition associated with dementia from her Down syndrome. -I have ordered a Roho pad for her stage I pressure injury -Adding a multivitamin as above -Advised frequent repositioning and offloading of the area as able

## 2023-02-16 NOTE — Assessment & Plan Note (Signed)
Stable. Weight loss seems to have plateaued.  Will continue with current interventions, should she start losing weight again, she would need to add a daily supplement. -Will add a daily multivitamin, she prefers liquid formulations

## 2023-04-17 ENCOUNTER — Ambulatory Visit (INDEPENDENT_AMBULATORY_CARE_PROVIDER_SITE_OTHER): Payer: Medicare Other | Admitting: Student

## 2023-04-17 ENCOUNTER — Encounter: Payer: Self-pay | Admitting: Student

## 2023-04-17 VITALS — BP 92/76 | HR 54 | Ht 59.0 in | Wt 81.0 lb

## 2023-04-17 DIAGNOSIS — Q909 Down syndrome, unspecified: Secondary | ICD-10-CM

## 2023-04-17 DIAGNOSIS — Z23 Encounter for immunization: Secondary | ICD-10-CM | POA: Diagnosis not present

## 2023-04-17 DIAGNOSIS — F0280 Dementia in other diseases classified elsewhere without behavioral disturbance: Secondary | ICD-10-CM

## 2023-04-17 MED ORDER — ZOSTER VAC RECOMB ADJUVANTED 50 MCG/0.5ML IM SUSR: 0.5000 mL | Freq: Once | INTRAMUSCULAR | 0 refills | Status: AC

## 2023-04-17 NOTE — Patient Instructions (Signed)
Sorry to hear that [facility] hasn't been feeling like herself. Hopefully she bounces back from this quickly. If not, let me know in a week or so.  I will have our palliative care folks give you a call to open a conversation about goals of care.  I will work with our clinic staff to have you seen in our geriatrics clinic.   Eliezer Mccoy, MD

## 2023-04-17 NOTE — Telephone Encounter (Signed)
Patient in clinic today with Dr. Marisue Humble. Provider requested update on DME order.   Community message sent to [employer]. Will await response.   Veronda Prude, RN

## 2023-04-17 NOTE — Progress Notes (Signed)
    SUBJECTIVE:   CHIEF COMPLAINT / HPI:   Down Syndrome Associated Dementia Malnutrition Rylee returns today for routine follow-up. We have been following her monthly for Down Syndrome Associated Dementia with weight loss. Her weight has been stable for the past several months. However, note that she is three pounds down today. Mom attributes this to her feeling poorly for the past several days, thinks perhaps she has some sort of viral illness, seems to be getting a bit better now--back to herself. Mom does not think she has had a change in appetite otherwise.  Ordered Roho cushion at last visit due to Stage I pressure ulcer. They have not heard back from [employer] about this.   Introduced the idea of palliative care, they are amenable.    OBJECTIVE:   BP 92/76   Pulse (!) 54   Ht 4\' 11"  (1.499 m)   Wt 81 lb (36.7 kg)   LMP 10/23/2021 (Approximate)   SpO2 100%   BMI 16.36 kg/m   Gen: Alert, engaged, greets me on arrival HENT: Mucous membranes are moist Cardio: Bradycardic, regular, without murmur Pulm: Normal WOB on RA, lungs clear MSK: Without edema or deformity  ASSESSMENT/PLAN:   Dementia due to Down syndrome (HCC) Weight loss of three pounds. I Elyshia mom is correct that this just represents viral illness, but I am wary that this may represent progression of her dementia. We will need to follow this closely.  I want to make sure she is getting good wraparound care, will therefore arrange to have her seen in [facility] with Dr. McDiarmid. No reason to think she is having any other cause for poor intake: dentition seems appropriate, does not seem to be having any dysphagia, etc.  - Geri clinic schedule pending  - High calorie foods reviewed - F/u in one month or sooner as needed  - Will work with RN team to check on status of her Roho cushion. - Consult to palliative care today       J [name], MD [facility]

## 2023-04-19 NOTE — Assessment & Plan Note (Addendum)
Weight loss of three pounds. [name] is correct that this just represents viral illness, but I am wary that this may represent progression of her dementia. We will need to follow this closely.  I want to make sure she is getting good wraparound care, will therefore arrange to have her seen in [facility] with Dr. McDiarmid. No reason to think she is having any other cause for poor intake: dentition seems appropriate, does not seem to be having any dysphagia, etc.  - Geri clinic schedule pending  - High calorie foods reviewed - F/u in one month or sooner as needed  - Will work with RN team to check on status of her Roho cushion. - Consult to palliative care today

## 2023-04-22 NOTE — Addendum Note (Signed)
Addended byPerley Jain, Ivy Meriwether D on: 04/22/2023 04:00 PM   Modules accepted: Orders

## 2023-04-22 NOTE — Telephone Encounter (Signed)
Roho cushion DME ordered.

## 2023-04-22 NOTE — Telephone Encounter (Signed)
Community message sent to [employer]. 

## 2023-05-16 ENCOUNTER — Ambulatory Visit (INDEPENDENT_AMBULATORY_CARE_PROVIDER_SITE_OTHER): Payer: Medicare Other | Admitting: Family Medicine

## 2023-05-16 VITALS — BP 102/52 | Ht <= 58 in | Wt 82.5 lb

## 2023-05-16 DIAGNOSIS — R636 Underweight: Secondary | ICD-10-CM | POA: Diagnosis not present

## 2023-05-16 DIAGNOSIS — F0280 Dementia in other diseases classified elsewhere without behavioral disturbance: Secondary | ICD-10-CM

## 2023-05-16 DIAGNOSIS — D7589 Other specified diseases of blood and blood-forming organs: Secondary | ICD-10-CM

## 2023-05-16 DIAGNOSIS — Q909 Down syndrome, unspecified: Secondary | ICD-10-CM | POA: Diagnosis not present

## 2023-05-16 NOTE — Progress Notes (Signed)
Provider:  Acquanetta Belling, MD Location:   [facility]   Place of Service:   [facility]  PCP: Dana Amel, MD Patient Care Team: Dana Amel, MD as PCP - General [name])  Extended Emergency Contact Information Primary Emergency Contact: [name]: [address]          [place], [place] [address] [name] of [place] Home Phone: [phone] Mobile Phone: [phone] Relation: Mother Secondary Emergency Contact: [name], [name], [place] Home Phone: [phone] Mobile Phone: [phone] Relation: Brother  Code Status: DNR Goals of Care: Advanced Directive information    05/16/2023    1:40 PM  Advanced Directives  Does Patient Have a Medical Advance Directive? No  Would patient like information on creating a medical advance directive? No - Patient declined     [name]:   Patient is accompanied by mother, [name] Primary caregiver:  mother Patient's Currently living arrangement:  mother Patient information was obtained from historical medical records, imaging reports: CT AP 03/2022, and lab reports  . History/Exam limitations:  communication barrier auditory processing and slower verbal response, reticience among strangers Primary Care Provider:   [name], MD Referring provider:  same Reason for referral:  weight loss  ---------------------------------------------------------------------------------------------------------------------------------------------------------------------------------------------------------------------------------------------------------------- I reviewed the [facility] form we ask patients and their caretakers to complete prior to the visit.  No SDOH difficulties   HPI by problems:  Chief Complaint  Patient presents with   Weight Loss    Unintentional    Cognition [name] enjoys going to [name], and movies,  reading, puzzles, and listening to music. [name] has a personal care aide as needed She does not have assistive devices  Importantly, [name] has not lost her ability to perform tasks she has previously mastered, including dressing, toileting, climbing steps, and while she needs help with showering only so her mother feels she has bathed thoroughly.   Taylynn's mother has not noticed any consistent decline in Ambriel's cognitive abilities, including episodic memory and procedural memory.  While [name] has delay in her auditory processing, she can express complex thoughts to those used to her communication style, ie., her mother.     Weight loss [name] started [name] are a restricted diet once she was told by [name] diagnosed [name] with DMT2 03/02/20 at A1c 8.1% and weight 131 pounds. Patient seen again for A1c a year later with A1c 5.5% and weight 119 pounds.  She has not had an elevated A1c since this 03/2021 visit.   Weight loss comments by [name] in her weight loss evaluation 11/07/21 - baseline weight 133-143 lbs. from 2017 to 2020 - 133 lbs 03/02/2020 > 119 lbs 02/27/2021 > 93 lbs today - mom had changed diet because of new T2DM diagnosis (A1c 8.1 on 03/02/2020, 5.5 on 03/28/2021)             - mom had reduced fast food, excess sugars, cookies             - still getting protein and "good carbs" at every meal [name] performed a extensive work up looking for other causes of unintentional weight loss, including CBC, CMP, TSH, BHB, CRP, A1c, Sed rate, HIV, RPR, UA, urine micro, urine pregnancy, urine microalbumin.  These results were unremarkable.  Patient screening/diagnostic colonoscopy 04/2022 was unremarkable. Her mammogram 06/23 was unremarkable.   Her mother has continued a restricted vegetarian diet w  MVI supplement, though she has introduced some liberalizations since meeting [name].     Unintentional Weight Loss in Older Adults  3 meal/d intake, recent: good, snacks 2-3 times a  day  Fraility: No    Medication effects: No   Level of physical activity: Active;  independent  Polypharmacy (> 4 medications): No   Malignancy Hx*: No   Ongoing inflammatory or increased catabolic condition: No   Recent acute Illness:  her mother thought she may hav had a viral illness of some sort   Emotional problems, especially depression*: No   Alcoholism / Substance Abuse: No   Paranoia: No   Swallowing disorders: No   Oral factors (e.g., poorly fitting dentures, caries): No   Food insecurity: No   Hyperthyroidism, hypothyroidism, hyperparathyroidism/hypercalcemia, hypoadrenalism:No    GI Disorders Hx* (Hiatal Hernia, Ischemic bowel, IBD, pancreatic insufficiency, PUD, GERD, celiac disease): No   Nausea and/or Vomiting: No   GI/Biliary surgeries: No   Eating problems (e.g., inability to feed self): No   Dental or denture problems: No   Low-salt, low-cholesterol diet:  Her mother had started a stingent diet for [name] when she was diagnosed with DMT2 in 06/21  Cognitive impairment:  no significant decline from baseline as best can tell from mother as informant  Immunocompromised: No   Diabetes:Yes ; Control: Yes    Organ Failure (Cardiac, Respiratory, Renal, Liver): No    Autoimmune Disorders (RA, SLE, etc): No    Neurologic Conditions (Stroke, Parkinsons, Chronic Pain, Dementia):  Down Syndrome  CT AP for abdominal pain (03/23/22): showed Large amount of retained fecal material throughout the colon and rectum, correlate for constipation or impaction.  ROS Dyspnea on exertion: No   Respiratory Pulmonary Disease Hx: No  Dyspnea:  No  Cough: No  Gastrointestinal History of peptic ulcers:  No  History of GERD: No   Indigestion/heartburn: No  Epigastric Pain: No  Nausea and/or Vomiting: No  Melena: No  Hematochezia: No  Abdomina Swelling: No  Diarrhea: No  Constipation:  BM every other day formed but not hard  Neuropsychologic Prolonged  sadness: No  Loss of pleasure: No  Paranoia: No  Anxiousness / fearfullness: No   Cancer Screening History Breast Cancer: Yes  Cervical Cancer: No  Did not clarify but likely low risk of HPV infection Colorectal Cancer: Yes     Outpatient Encounter Medications as of 05/16/2023  Medication Sig   Accu-Chek Softclix Lancets lancets Use as instructed   Blood Glucose Monitoring Suppl (ACCU-CHEK GUIDE ME) w/Device KIT 1 kit by Does not apply route as needed.   clonazePAM (KLONOPIN) 0.5 MG tablet TAKE 1 TABLET(0.5 MG) BY MOUTH DAILY   glucose blood (ACCU-CHEK AVIVA) test strip Use to check sugar daily as needed.   Multiple Vitamin (MULTIVITAMIN) LIQD Take 5 mLs by mouth daily.   traZODone (DESYREL) 50 MG tablet TAKE 1/2 TO 1 TABLET BY MOUTH AT BEDTIME AS NEEDED FOR INSOMNIA   No facility-administered encounter medications on file as of 05/16/2023.    History Patient Active Problem List   Diagnosis Date Noted   Dementia due to Down syndrome (HCC) 11/07/2021    Priority: High   Mood disorder (HCC) 01/11/2012    Priority: High   Pressure injury of buttock, stage 1 02/16/2023    Priority: Medium    PCOS (polycystic ovarian syndrome) 11/10/2008    Priority: Medium    Down syndrome 11/10/2008    Priority: Medium    Mixed hyperlipidemia 10/31/2006  Priority: Medium    Macrocytosis 05/17/2023   Underweight 05/16/2023   Encounter for long-term (current) use of high-risk medication 11/30/2010   Past Medical History:  Diagnosis Date   Down syndrome    DVT (deep venous thrombosis) (HCC) 11/05/2018   Provoked by OCP   Hyperlipidemia    Mood disorder (HCC)    Obesity    PCOS (polycystic ovarian syndrome)    Past Surgical History:  Procedure Laterality Date   CYSTOSCOPY     Family History  Problem Relation Age of Onset   Colon cancer Neg Hx    Esophageal cancer Neg Hx    Stomach cancer Neg Hx    Rectal cancer Neg Hx    Social History   Socioeconomic History   Marital  status: Single    Spouse name: Not on file   Number of children: Not on file   Years of education: Not on file   Highest education level: Not on file  Occupational History   Occupation: disable  Tobacco Use   Smoking status: Never    Passive exposure: Never   Smokeless tobacco: Never  Vaping Use   Vaping status: Never Used  Substance and Sexual Activity   Alcohol use: No   Drug use: No   Sexual activity: Never  Other Topics Concern   Not on file  Social History Narrative   Nandika lives in a 3 floor home with two steps into home.    She enjoys going to [employer], movies, reading, doing puzzles, and listening to music.    Katherene Ponto, her mother, is Ivelisse's primary caregiver. Gwen Aguillon drives Zo to appointments.    There is a brother of Jearldean who can help out as needed.       Mabry and her mother are vegetarians.       Brady completed her high school under exception children programs    Social Determinants of Health   Financial Resource Strain: Low Risk  (02/11/2023)   Overall Financial Resource Strain (CARDIA)    Difficulty of Paying Living Expenses: Not hard at all  Food Insecurity: No Food Insecurity (02/11/2023)   Hunger Vital Sign    Worried About Running Out of Food in the Last Year: Never true    Ran Out of Food in the Last Year: Never true  Transportation Needs: No Transportation Needs (02/11/2023)   PRAPARE - Administrator, Civil Service (Medical): No    Lack of Transportation (Non-Medical): No  Physical Activity: Inactive (02/11/2023)   Exercise Vital Sign    Days of Exercise per Week: 0 days    Minutes of Exercise per Session: 0 min  Stress: No Stress Concern Present (02/11/2023)   [employer] of Occupational Health - Occupational Stress Questionnaire    Feeling of Stress : Not at all  Social Connections: Moderately Isolated (02/11/2023)   Social Connection and Isolation Panel [NHANES]    Frequency of Communication with Friends and Family: Once a  week    Frequency of Social Gatherings with Friends and Family: Three times a week    Attends Religious Services: More than 4 times per year    Active Member of Clubs or Organizations: No    Attends Banker Meetings: Never    Marital Status: Never married       Caregiver Stress Self-Assessment (Zarit Score):  0 out of 80 Scoring: 0-20 = Little/No stress       21-40 = Mild/Moderate stress  41-60 = Moderate/Severe stress      61-80 = Severe stress   FALLS in last five office visits:     02/15/2023    9:07 AM 02/11/2023    2:20 PM 09/19/2022   10:03 AM 08/22/2022    9:53 AM 03/03/2019    3:31 PM  Fall Risk   Falls in the past year? 0 0 0 0 0  Number falls in past yr:  0 0 0 0  Injury with Fall?  0 0 0   Risk for fall due to :  No Fall Risks     Follow up  Falls prevention discussed;Education provided;Falls evaluation completed       Health Maintenance reviewed: Immunization History  Administered Date(s) Administered   Influenza,inj,Quad PF,6+ Mos 09/26/2022   PFIZER(Purple Top)SARS-COV-2 Vaccination 07/02/2020   Health Maintenance Topics with due status: Overdue     Topic Date Due   DTaP/Tdap/Td Never done   Zoster Vaccines- Shingrix Never done   PAP SMEAR-Modifier 11/29/2013   INFLUENZA VACCINE 04/04/2023      Vital Signs Weight: 82 lb 8 oz (37.4 kg) Body mass index is 17.37 kg/m. CrCl cannot be calculated (Patient's most recent lab result is older than the maximum 21 days allowed.). Body surface area is 1.23 meters squared. Vitals:   05/16/23 1341  BP: (!) 102/52  Weight: 82 lb 8 oz (37.4 kg)  Height: 4' 9.8" (1.468 m)   Wt Readings from Last 3 Encounters:  05/16/23 82 lb 8 oz (37.4 kg)  04/17/23 81 lb (36.7 kg)  02/15/23 84 lb 9.6 oz (38.4 kg)   No results found.  Physical Examination:  VS reviewed Physical Exam  Vitals:   05/16/23 1341  BP: (!) 102/52    HEENT: Bilateral EAC adequately patent, I.e., able to see TMs General  physical exam: well-dressed and groomed. Pleasant and cooperative. Cardiac: Regular rate and rhythm without murmur. No carotid bruits Lungs: Clear to auscultation.               Labs No components found for: "VITAMIND"  Lab Results  Component Value Date   VITAMINB12 602 01/30/2022    Lab Results  Component Value Date   FOLATE 10.6 01/26/2022    Lab Results  Component Value Date   TSH 1.10 02/07/2022    No results found for: "RPR"  Lab Results  Component Value Date   HIV Non Reactive 11/07/2021      Chemistry      Component Value Date/Time   NA 143 02/07/2022 1010   K 4.1 02/07/2022 1010   CL 105 02/07/2022 1010   CO2 24 02/07/2022 1010   BUN 18 02/07/2022 1010   CREATININE 0.85 02/07/2022 1010   CREATININE 1.12 (H) 02/19/2014 1137      Component Value Date/Time   CALCIUM 9.4 02/07/2022 1010   ALKPHOS 62 02/07/2022 1010   AST 21 02/07/2022 1010   ALT 13 02/07/2022 1010   BILITOT 0.4 02/07/2022 1010          Lab Results  Component Value Date   HGBA1C 5.1 08/22/2022     @10RELATIVEDAYS @No  results found. Lab Results  Component Value Date   WBC 3.9 (L) 02/07/2022   HGB 14.7 02/07/2022   HCT 43.4 02/07/2022   MCV 105.2 (H) 02/07/2022   PLT 283.0 02/07/2022    No results found for this or any previous visit (from the past 24 hour(s)).    ------------------------------------------------------------------------------------------------------------------------------------------------------------------------------------------------------------------------------------------------------------------------------------------------------------------------------------------------------------------------------------------------------------------------------------------------------------------------------------------------------------  Assessment and Plan: Please see individual consultation  notes from physical therapy, pharmacy and social work for today.     Problem List Items Addressed This Visit       High   Dementia due to Down syndrome [name])    The diagnosis of dementia for Dana Varney appears to have been based primarily on a decrease in appetite and weight loss than of any other decline in cognitive capacity or functional ability.  With her mother, [name], as the knowledgeable informant, there does not appear to be any decline in [name]'s ability to perform the tasks she has previously mastered in her life.  Her mother feels certain that [name] episodic memory for recent events has not diminished, nor her long term memory.   Given that memory impairment, especially for episodic/autobiographical memory formation, is almost a universally part of Alzheimer's Dementia, and that [name] has not demonstrated a decline in her performance of her previously mastered tasks, I am less certain that dementia describes [name]'s current cognitive condition.  If there is some significant underlying dementia and it is thought important to make a diagnosis, evaluation by a Neuropsychologist would be an appropriate approach.           Unprioritized   Underweight - Primary    [name] had a progressive decline in her weight to nadir of 81 pounds with BMI 16.3 pounds.  My working cause is dietary restriction given the weight loss appears to have some temporal relation after the patient's diagnosis of diabetes and the patient's mother dietary changes designed for control of diabetes.   Extensive biochemical work up along with testing for [name] and breast cancer have been unrevealing.    Given [name]'s history of being full of stool on CT AP last July, watching for constipation would be reasonable.  I believe that [name] has protein-caloric malnutrition with associated underweight status with BMI < 17.5%.  She has temporal and medial thigh subcutaneous mass and muscle wasting.  Her has sunken eyes and sunken facial cheeks. There is prominent supraclavicular  fossas bilaterally.   While Terryn is eating, I believe she needs to get to about gain about 13 - 15 pounds to have an safe BMI. I would like to see if if will be possible to get supplemental nutrition liquids/shakes thru patient's Medicaid.   Dana Gerilyn Pilgrim, PhD, RD, LDN will see patient on 9/16 to assess in greater detail [name]'s nutritional status, needs and nutritional recommendations.         Macrocytosis    Interestingly, macrocytosis is associated with Down Syndrome.  The reason has not been well explained. Other than the ability to mask IDA, their does not appear to be Olson significance to this isolated finding.       Underweight [name] had a progressive decline in her weight to nadir of 81 pounds with BMI 16.3 pounds.  My working cause is dietary restriction given the weight loss appears to have some temporal relation after the patient's diagnosis of diabetes and the patient's mother dietary changes designed for control of diabetes.   Extensive biochemical work up along with testing for [facility] and breast cancer have been unrevealing.    Given Shandrika's history of being full of stool on CT AP last July, watching for constipation would be reasonable.  I believe that [name] has protein-caloric malnutrition with associated underweight status with BMI < 17.5%.  She has temporal and medial thigh subcutaneous mass and muscle wasting.  Her has sunken eyes and sunken facial cheeks. There is  prominent supraclavicular fossas bilaterally.   While [name] is eating, I believe she needs to get to about gain about 13 - 15 pounds to have an safe BMI. I would like to see if if will be possible to get supplemental nutrition liquids/shakes thru patient's Medicaid.   Dana Gerilyn Pilgrim, PhD, RD, LDN will see patient on 9/16 to assess in greater detail [name]'s nutritional status, needs and nutritional recommendations.     Dementia due to Down syndrome [facility]) The diagnosis of dementia for Dana Rummell appears to  have been based primarily on a decrease in appetite and weight loss than of any other decline in cognitive capacity or functional ability.  With her mother, Tensley Gradilla, as the knowledgeable informant, there does not appear to be any decline in Sylvanna's ability to perform the tasks she has previously mastered in her life.  Her mother feels certain that Maranatha's episodic memory for recent events has not diminished, nor her long term memory.   Given that memory impairment, especially for episodic/autobiographical memory formation, is almost a universally part of Alzheimer's Dementia, and that Roseana has not demonstrated a decline in her performance of her previously mastered tasks, I am less certain that dementia describes Anamae's current cognitive condition.  If there is some significant underlying dementia and it is thought important to make a diagnosis, evaluation by a Neuropsychologist would be an appropriate approach.     Macrocytosis Interestingly, macrocytosis is associated with Down Syndrome.  The reason has not been well explained. Other than the ability to mask IDA, their does not appear to be Olson significance to this isolated finding.       Primary Contact: Extended Emergency Contact Information Primary Emergency Contact: Iorio,[name] Olson: [address]          [place], [place] [address] [name] of [place] Home Phone: [phone] Mobile Phone: [phone] Relation: Mother Secondary Emergency Contact: CORTANA, CLEWS, [place] Home Phone: [phone] Mobile Phone: [phone] Relation: Brother   50 minutes face to face were spent in total with precharting, history & physical gathering, nterdisciplinary discussion, patient and caretaker counseling and coordination of care and documentation. The Geriatric medical team meet to discuss the patient's Greater than 50% of this time was spent in counseling, explanation of diagnosis, planning of further  management, and coordination of care.

## 2023-05-16 NOTE — Patient Instructions (Signed)
It was a pleasure meeting you both.   Dr Ernisha Sorn will look into getting a nutritional supplement through [facility].   If Ms Bevard has Alzheimer's Dementia it is very mild, very early type.   Should a more certain diagnosis be required, Chrystle should be evaluated by a Neuropsychologist.   Watch for a decline in her ability to perform tasks that she had previously mastered.  I would recommend avoiding any dietary restrictions for [facility].  If she had diabetes, she could still stand to gain at least ten pounds without risk of developing out of control blood sugars.

## 2023-05-16 NOTE — Progress Notes (Unsigned)
Medical Nutrition Therapy PCP Darnelle Spangle, MD [name]  Appt start time: 1100 end time: 1200 (1 hour) Primary concerns today: Weight management and nutritional assessment .   Relevant history/background: [name] was referred by Acquanetta Belling, MD for MNT related to weight loss and malnutrition assessment (>30 lb lost in ~20 months following 2021DM dx, with continued loss from 91 lb on 01/26/22 to 82.5 lb on 05/16/23).  Other diagnoses include Down syndr, HLD, & PCOS.  Diabetes Type 2 was diagnosed in June 2021, but recent A1c's have been in normal range (5.1, 5.0, 5.5%).  Extensive workup has ruled out insulinoma and other potential explanations of her weight loss.  Poor appetite may well be secondary to Down Syndrome-related dementia, although her mom reports no other behavioral changes. [name] was seen in Nutrition Clinic in Dec 2023 for unintentional weight loss, where recommendations were given regarding ways to incorporate higher kcal-dense foods/ingredients.  ***   Assessment:  ***  Usual eating pattern: *** meals and *** snacks per day. Frequent foods and beverages: ***.   Avoided foods: ***.   Usual physical activity: ***. Sleep: Estimates *** hours/night. Food security -  Within the past 12 months, did you run out or worry that you would run out of food, and not have money to get more?  {yes no:314532}    24-hr recall: (Up at  AM) B ( AM)-    Snk ( AM)-    L ( PM)-   Snk ( PM)-   D ( PM)-   Snk ( PM)-   Typical day? {yes no:314532}   Nutritional Diagnosis:  {CHL AMB NUTRITIONAL [phone]}  Handouts given during visit include: After-Visit Summary (AVS) ***  Demonstrated degree of understanding via:  Teach Back  Barriers to learning/adherence to lifestyle change: ***  For behavioral goals and recommendations, see Patient Instructions.    Monitoring/Evaluation:  Dietary intake, exercise, and body weight {follow up:15908}.

## 2023-05-17 ENCOUNTER — Encounter: Payer: Self-pay | Admitting: Family Medicine

## 2023-05-17 ENCOUNTER — Ambulatory Visit: Payer: Medicare Other | Admitting: Student

## 2023-05-17 DIAGNOSIS — D7589 Other specified diseases of blood and blood-forming organs: Secondary | ICD-10-CM | POA: Insufficient documentation

## 2023-05-17 NOTE — Assessment & Plan Note (Addendum)
[name] had a progressive decline in her weight to nadir of 81 pounds with BMI 16.3 pounds.  My working cause is dietary restriction given the weight loss appears to have some temporal relation after the patient's diagnosis of diabetes and the patient's mother dietary changes designed for control of diabetes.   Extensive biochemical work up along with testing for [facility] and breast cancer have been unrevealing.    Given [name]'s history of being full of stool on CT AP last July, watching for constipation would be reasonable.  I believe that [name] has protein-caloric malnutrition with associated underweight status with BMI < 17.5%.  She has temporal and medial thigh subcutaneous mass and muscle wasting.  Her has sunken eyes and sunken facial cheeks. There is prominent supraclavicular fossas bilaterally.   While [name] is eating, I believe she needs to get to about gain about 13 - 15 pounds to have an safe BMI. I would like to see if if will be possible to get supplemental nutrition liquids/shakes thru patient's Medicaid.   Dr Gerilyn Pilgrim, PhD, RD, LDN will see patient on 9/16 to assess in greater detail [name]'s nutritional status, needs and nutritional recommendations.

## 2023-05-17 NOTE — Assessment & Plan Note (Signed)
Interestingly, macrocytosis is associated with Down Syndrome.  The reason has not been well explained. Other than the ability to mask IDA, their does not appear to be clinic significance to this isolated finding.

## 2023-05-17 NOTE — Assessment & Plan Note (Signed)
The diagnosis of dementia for [name] appears to have been based primarily on a decrease in appetite and weight loss than of any other decline in cognitive capacity or functional ability.  With her mother, [name], as the knowledgeable informant, there does not appear to be any decline in [name]'s ability to perform the tasks she has previously mastered in her life.  Her mother feels certain that [name]'s episodic memory for recent events has not diminished, nor her long term memory.   Given that memory impairment, especially for episodic/autobiographical memory formation, is almost a universally part of Alzheimer's Dementia, and that [name] has not demonstrated a decline in her performance of her previously mastered tasks, I am less certain that dementia describes [name]'s current cognitive condition.  If there is some significant underlying dementia and it is thought important to make a diagnosis, evaluation by a Neuropsychologist would be an appropriate approach.

## 2023-05-20 ENCOUNTER — Ambulatory Visit (INDEPENDENT_AMBULATORY_CARE_PROVIDER_SITE_OTHER): Payer: Medicare Other | Admitting: Family Medicine

## 2023-05-20 ENCOUNTER — Encounter: Payer: Self-pay | Admitting: Family Medicine

## 2023-05-20 VITALS — Ht <= 58 in | Wt 81.0 lb

## 2023-05-20 DIAGNOSIS — R634 Abnormal weight loss: Secondary | ICD-10-CM

## 2023-05-20 NOTE — Patient Instructions (Addendum)
Nutrition principles to continue:  At least 3 meals per day, incorporating snacks when possible.   Start breakfast no later than 7:30 AM.   Include a protein food with each meal.   Emphasize whole, real food over highly processed foods.   Offer a nutritional supplement drink daily.  (If using a protein powder, mix only a half-dose in milk, which already has 1 gram of protein per ounce.)  Ways to increase [name]'s calorie intake - Buy some whole-milk Skyr.  - Include any kind of nut butter at least 4-5 times a week.   - Include a source of carbohydrate at each meal, e.g., a sandwich, potatoes, rice.  (Check the labels on foods such as soups; it is perfectly fine for [name] to have up to 45 g of carb at each meal.) - Get in the habit of adding some XV olive oil to foods such as vegetables, rice, and potatoes.   - To improve the nutritional quality of canned soup, first heat up some frozen or fresh vegetables, then add the soup, and re-heat.  (Or add some protein food such as beans or chicken).   - Consider adding a bit of olive oil to soups/chili.    Follow-up:  Call [name] to schedule as needed:  [phone].

## 2023-05-27 ENCOUNTER — Encounter: Payer: Self-pay | Admitting: Family Medicine

## 2023-05-27 DIAGNOSIS — R634 Abnormal weight loss: Secondary | ICD-10-CM | POA: Insufficient documentation

## 2023-05-27 DIAGNOSIS — E43 Unspecified severe protein-calorie malnutrition: Secondary | ICD-10-CM | POA: Insufficient documentation

## 2023-06-14 ENCOUNTER — Other Ambulatory Visit: Payer: Self-pay | Admitting: Student

## 2023-06-14 DIAGNOSIS — Q909 Down syndrome, unspecified: Secondary | ICD-10-CM

## 2023-06-27 ENCOUNTER — Ambulatory Visit (INDEPENDENT_AMBULATORY_CARE_PROVIDER_SITE_OTHER): Payer: Medicare Other | Admitting: Family Medicine

## 2023-06-27 VITALS — BP 79/52 | HR 55 | Ht <= 58 in | Wt 84.2 lb

## 2023-06-27 DIAGNOSIS — R636 Underweight: Secondary | ICD-10-CM

## 2023-06-27 NOTE — Patient Instructions (Signed)
Good job gaining three pounds in a month. Keep eating like you are.   Let's meet again in 4 weeks to see how you are doing.

## 2023-06-27 NOTE — Progress Notes (Signed)
[name] is accompanied by mother Sources of clinical information for visit is/are parent. Nursing assessment for this office visit was reviewed with the patient for accuracy and revision.     Previous Report(s) Reviewed: none     06/27/2023   10:39 AM  Depression screen PHQ 2/9  Decreased Interest 0  Down, Depressed, Hopeless 0  PHQ - 2 Score 0  Altered sleeping 0  Tired, decreased energy 0  Change in appetite 0  Feeling bad or failure about yourself  0  Trouble concentrating 0  Moving slowly or fidgety/restless 0  Suicidal thoughts 0  PHQ-9 Score 0  Difficult doing work/chores Not difficult at all   Flowsheet Row Office Visit from 06/27/2023 in [facility] Office Visit from 10/31/2022 in [facility] Office Visit from 09/19/2022 in [facility]  Thoughts that you would be better off dead, or of hurting yourself in some way Not at all Not at all Not at all  PHQ-9 Total Score 0 0 0          02/15/2023    9:07 AM 02/11/2023    2:20 PM 09/19/2022   10:03 AM 08/22/2022    9:53 AM 03/03/2019    3:31 PM  Fall Risk   Falls in the past year? 0 0 0 0 0  Number falls in past yr:  0 0 0 0  Injury with Fall?  0 0 0   Risk for fall due to :  No Fall Risks     Follow up  Falls prevention discussed;Education provided;Falls evaluation completed          06/27/2023   10:39 AM 02/15/2023    9:07 AM 02/11/2023    2:20 PM  PHQ9 SCORE ONLY  PHQ-9 Total Score 0 0 0    There are no preventive care reminders to display for this patient.  Health Maintenance Due  Topic Date Due   DTaP/Tdap/Td (1 - Tdap) Never done   Zoster Vaccines- Shingrix (1 of 2) Never done   Cervical Cancer Screening (HPV/Pap Cotest)  Never done   COVID-19 Vaccine (2 - Pfizer risk series) 07/23/2020   INFLUENZA VACCINE  04/04/2023      History/P.E. limitations: intellectual disabilty  There are no preventive care reminders to display for  this patient. There are no preventive care reminders to display for this patient.  Health Maintenance Due  Topic Date Due   DTaP/Tdap/Td (1 - Tdap) Never done   Zoster Vaccines- Shingrix (1 of 2) Never done   Cervical Cancer Screening (HPV/Pap Cotest)  Never done   COVID-19 Vaccine (2 - Pfizer risk series) 07/23/2020   INFLUENZA VACCINE  04/04/2023     Chief Complaint  Patient presents with   Weight f/u     --------------------------------------------------------------------------------------------------------------------------------------------- Visit Problem List with A/P  No problem-specific Assessment & Plan notes found for this encounter.

## 2023-06-28 ENCOUNTER — Encounter: Payer: Self-pay | Admitting: Family Medicine

## 2023-06-28 NOTE — Assessment & Plan Note (Signed)
Established problem with improvement [name] has gained 3 pounds since visit with Dr Gerilyn Pilgrim. Her mother has liberalized her diet some.  She is providing larger portions. No change in [name]'s usual activities and capabilities   [name] will follow up in a month to continue to monitor her weight

## 2023-08-08 ENCOUNTER — Encounter: Payer: Self-pay | Admitting: Family Medicine

## 2023-08-08 ENCOUNTER — Ambulatory Visit: Payer: Medicare Other | Admitting: Family Medicine

## 2023-08-08 VITALS — BP 108/68 | HR 54 | Ht 58.27 in | Wt 91.2 lb

## 2023-08-08 DIAGNOSIS — R634 Abnormal weight loss: Secondary | ICD-10-CM | POA: Diagnosis not present

## 2023-08-08 LAB — POCT URINALYSIS DIP (MANUAL ENTRY)
Bilirubin, UA: NEGATIVE
Blood, UA: NEGATIVE
Glucose, UA: NEGATIVE mg/dL
Ketones, POC UA: NEGATIVE mg/dL
Nitrite, UA: POSITIVE — AB
Spec Grav, UA: 1.025 (ref 1.010–1.025)
Urobilinogen, UA: 0.2 U/dL
pH, UA: 7 (ref 5.0–8.0)

## 2023-08-08 LAB — POCT UA - MICROSCOPIC ONLY: RBC, Urine, Miroscopic: NONE SEEN (ref 0–2)

## 2023-08-08 NOTE — Patient Instructions (Signed)
You continue to gain weight.  That is [place].  Keep eating those dense foods.    Your BMI is now in the lower end of normal.  Great!  Your blood pressure has mover from the abnormally low to the normal range with top number greater than 100 but less than 120.   Follow up with Dr Marisue Humble in a month to make sure the weight remains stable or increasing.    Dr Breken Nazari wil request you be placed on my panel when Dr Marisue Humble graduates.

## 2023-08-08 NOTE — Assessment & Plan Note (Signed)
Established problem that has improved and has meet goal of weight gain and BMI is in low normal range.  Wt Readings from Last 3 Encounters:  08/08/23 91 lb 3.2 oz (41.4 kg)  06/27/23 84 lb 3.2 oz (38.2 kg)  05/20/23 81 lb (36.7 kg)  Body mass index is 18.89 kg/m.  Emphasis on calorically dense foods.   Blood pressure improved with weight gain.  Continue current dietary intervention.   Follow-up with Dr Marisue Humble in a month to monitor stability or increase in weight.

## 2023-08-08 NOTE — Progress Notes (Signed)
[name] is accompanied by mother Sources of clinical information for visit is/are relative(s). Nursing assessment for this office visit was reviewed with the patient for accuracy and revision.     Previous Report(s) Reviewed: none     08/08/2023   10:23 AM  Depression screen PHQ 2/9  Decreased Interest 0  Down, Depressed, Hopeless 0  PHQ - 2 Score 0  Altered sleeping 0  Tired, decreased energy 0  Change in appetite 0  Feeling bad or failure about yourself  0  Trouble concentrating 0  Moving slowly or fidgety/restless 0  Suicidal thoughts 0  PHQ-9 Score 0  Difficult doing work/chores Not difficult at all   [employer] Office Visit from 08/08/2023 in [facility] [place]. [facility] Visit from 06/27/2023 in [facility] [place]. [facility] Office Visit from 10/31/2022 in [facility]  Thoughts that you would be better off dead, or of hurting yourself in some way Not at all Not at all Not at all  PHQ-9 Total Score 0 0 0          08/08/2023   10:24 AM 02/15/2023    9:07 AM 02/11/2023    2:20 PM 09/19/2022   10:03 AM 08/22/2022    9:53 AM  Fall Risk   Falls in the past year? 0 0 0 0 0  Number falls in past yr: 0  0 0 0  Injury with Fall? 0  0 0 0  Risk for fall due to :   No Fall Risks    Follow up   Falls prevention discussed;Education provided;Falls evaluation completed         08/08/2023   10:23 AM 06/27/2023   10:39 AM 02/15/2023    9:07 AM  PHQ9 SCORE ONLY  PHQ-9 Total Score 0 0 0    There are no preventive care reminders to display for this patient.  Health Maintenance Due  Topic Date Due   DTaP/Tdap/Td (1 - Tdap) Never done   Zoster Vaccines- Shingrix (1 of 2) Never done   Cervical Cancer Screening (HPV/Pap Cotest)  Never done   COVID-19 Vaccine (2 - Pfizer risk series) 07/23/2020   INFLUENZA VACCINE  04/04/2023       History/P.E. limitations: none  There are no preventive care reminders to display for this patient. There are no preventive care reminders to display for this patient.  Health Maintenance Due  Topic Date Due   DTaP/Tdap/Td (1 - Tdap) Never done   Zoster Vaccines- Shingrix (1 of 2) Never done   Cervical Cancer Screening (HPV/Pap Cotest)  Never done   COVID-19 Vaccine (2 - Pfizer risk series) 07/23/2020   INFLUENZA VACCINE  04/04/2023     Chief Complaint  Patient presents with   Follow-up     --------------------------------------------------------------------------------------------------------------------------------------------- Visit Problem List with A/P  Unintentional weight loss Established problem that has improved and has meet goal of weight gain and BMI is in low normal range.  Wt Readings from Last 3 Encounters:  08/08/23 91 lb 3.2 oz (41.4 kg)  06/27/23 84 lb 3.2 oz (38.2 kg)  05/20/23 81 lb (36.7 kg)  Body mass index is 18.89 kg/m.  Emphasis on calorically dense foods.   Blood pressure improved with weight gain.  Continue current dietary intervention.   Follow-up with Dr Marisue Humble in a  month to monitor stability or increase in weight.   CPT E&M Office Visit Time  Total Visit Time: 20 minutes

## 2023-08-09 ENCOUNTER — Other Ambulatory Visit: Payer: Self-pay | Admitting: Student

## 2023-08-09 DIAGNOSIS — Q909 Down syndrome, unspecified: Secondary | ICD-10-CM

## 2023-08-09 DIAGNOSIS — G4700 Insomnia, unspecified: Secondary | ICD-10-CM

## 2023-09-10 ENCOUNTER — Ambulatory Visit: Payer: Medicare Other | Admitting: Student

## 2023-10-08 ENCOUNTER — Encounter: Payer: Self-pay | Admitting: Student

## 2023-10-08 ENCOUNTER — Ambulatory Visit (INDEPENDENT_AMBULATORY_CARE_PROVIDER_SITE_OTHER): Payer: Medicare Other | Admitting: Student

## 2023-10-08 VITALS — BP 90/69 | HR 55 | Ht <= 58 in | Wt 85.4 lb

## 2023-10-08 DIAGNOSIS — R634 Abnormal weight loss: Secondary | ICD-10-CM

## 2023-10-08 NOTE — Patient Instructions (Signed)
 [name], It is always a great to see you!  It seems like you are making all of the right changes in your diet.  Remember that ice cream and peanut butter are your friends.  You are not going to overdo it on protein.  The protein shakes are just fine.  I would prioritize proteins and fats over carbohydrates, though in your case in a calorie is probably a good calorie.  [name] Con Gasman, MD

## 2023-10-08 NOTE — Progress Notes (Signed)
    SUBJECTIVE:   CHIEF COMPLAINT / HPI:   Unintentional Weight Loss Follow-up She is here accompanied by her mother for follow-up of unintentional weight loss.  She has had her last few visits with Dr. McDiarmid but returns to my care today.  Dr. McDiarmid feels that this does not represent early dementia due to Down syndrome as previously thought but is simply a result of inadequate caloric intake that is multifactorial and at least in part driven by a low caloric density, health-oriented eating pattern.  Dr. McDiarmid and I have encouraged favoring high caloric-density and high protein food options. She is drinking protein shakes.  No change to her usual activities or abilities.  No changes in mentation.   OBJECTIVE:   BP 90/69   Pulse (!) 55   Ht 4' 10 (1.473 m)   Wt 85 lb 6.4 oz (38.7 kg)   LMP 10/23/2021 (Approximate)   BMI 17.85 kg/m   Gen: Alert, smiling, engaged HEENT: Mucous membranes moist Pulm: Normal work of breathing on room air MSK: Without edema or deformity  ASSESSMENT/PLAN:   Unintentional weight loss Weight appears to have dropped from her last visit with Dr. Keneth, however I suspect that the last charted weight (91lbs) was spurious as her weight has been overall consistent at around 85 pounds since we started increasing her caloric density last summer.  She does not appear to be losing any further weight at this point.  -Continue current dietary interventions -Her mother will continue to monitor her mentation and alert us  to any new or developing signs of dementia -As she has been stable for many months now, I believe we can safely space out follow-ups. She will transition to Dr. McDiarmid's patient panel upon my graduation in June     J Con Gasman, MD [facility]

## 2023-10-10 NOTE — Assessment & Plan Note (Addendum)
 Weight appears to have dropped from her last visit with Dr. Keneth, however I suspect that the last charted weight (91lbs) was spurious as her weight has been overall consistent at around 85 pounds since we started increasing her caloric density last summer.  She does not appear to be losing any further weight at this point.  -Continue current dietary interventions -Her mother will continue to monitor her mentation and alert us  to any new or developing signs of dementia -As she has been stable for many months now, I believe we can safely space out follow-ups. She will transition to Dr. McDiarmid's patient panel upon my graduation in June

## 2023-11-05 ENCOUNTER — Other Ambulatory Visit: Payer: Self-pay | Admitting: Student

## 2023-11-05 DIAGNOSIS — G4700 Insomnia, unspecified: Secondary | ICD-10-CM

## 2023-11-05 DIAGNOSIS — Q909 Down syndrome, unspecified: Secondary | ICD-10-CM

## 2023-12-06 ENCOUNTER — Ambulatory Visit (HOSPITAL_COMMUNITY)
Admission: EM | Admit: 2023-12-06 | Discharge: 2023-12-06 | Disposition: A | Discharge status: Home / Self Care | Attending: Physician Assistant | Admitting: Physician Assistant

## 2023-12-06 ENCOUNTER — Encounter (HOSPITAL_COMMUNITY): Payer: Self-pay | Admitting: *Deleted

## 2023-12-06 ENCOUNTER — Other Ambulatory Visit: Payer: Self-pay

## 2023-12-06 DIAGNOSIS — K047 Periapical abscess without sinus: Secondary | ICD-10-CM | POA: Diagnosis not present

## 2023-12-06 DIAGNOSIS — K029 Dental caries, unspecified: Secondary | ICD-10-CM

## 2023-12-06 MED ORDER — KETOROLAC TROMETHAMINE 30 MG/ML IJ SOLN
INTRAMUSCULAR | Status: AC
  Filled 2023-12-06: qty 1

## 2023-12-06 MED ORDER — AMOXICILLIN-POT CLAVULANATE 400-57 MG/5ML PO SUSR: 45.0000 mg/kg/d | Freq: Two times a day (BID) | ORAL | 0 refills | Status: AC

## 2023-12-06 MED ORDER — KETOROLAC TROMETHAMINE 30 MG/ML IJ SOLN
30.0000 mg | Freq: Once | INTRAMUSCULAR | Status: AC
  Administered 2023-12-06: 30 mg via INTRAMUSCULAR

## 2023-12-06 MED ORDER — AMOXICILLIN-POT CLAVULANATE 400-57 MG/5ML PO SUSR: 400.0000 mg | Freq: Two times a day (BID) | ORAL | 0 refills | Status: DC

## 2023-12-06 NOTE — ED Provider Notes (Signed)
 [facility]    CSN: [id number] Arrival date & time: 12/06/23  1607      History   Chief Complaint Chief Complaint  Patient presents with   Dental Problem    HPI [name] is a 54 y.o. female.   HPI  Patient is here with her mother who is providing most of the HPI  Her family reports concerns for potential dental pain ongoing for some time. Her caregiver is not sure how long it has been ongoing due to patient being less communicative  Her caregiver denies swelling or signs of drainage in the mouth She states the patient has had some redness to her face over the area   Interventions: Tylenol for pain     Past Medical History:  Diagnosis Date   Down syndrome    DVT (deep venous thrombosis) (HCC) 11/05/2018   Provoked by OCP   Hyperlipidemia    Mood disorder (HCC)    Obesity    PCOS (polycystic ovarian syndrome)     Patient Active Problem List   Diagnosis Date Noted   Unintentional weight loss 05/27/2023   Macrocytosis associated with Down Syndrome 05/17/2023   Underweight 05/16/2023   Pressure injury of buttock, stage 1 02/16/2023   Mood disorder (HCC) 01/11/2012   Encounter for long-term (current) use of high-risk medication 11/30/2010   PCOS (polycystic ovarian syndrome) 11/10/2008   Down syndrome 11/10/2008   Mixed hyperlipidemia 10/31/2006    Past Surgical History:  Procedure Laterality Date   CYSTOSCOPY      OB History   No obstetric history on file.      Home Medications    Prior to Admission medications   Medication Sig Start Date End Date Taking? Authorizing Provider  clonazePAM (KLONOPIN) 0.5 MG tablet TAKE 1 TABLET(0.5 MG) BY MOUTH DAILY 06/14/23  Yes Alicia Amel, MD  Multiple Vitamin (MULTIVITAMIN) LIQD Take 5 mLs by mouth daily. 02/15/23  Yes Alicia Amel, MD  traZODone (DESYREL) 50 MG tablet TAKE 1/2 TO 1 TABLET BY MOUTH AT BEDTIME AS NEEDED FOR INSOMNIA 11/06/23  Yes Alicia Amel, MD  Accu-Chek Softclix  Lancets lancets Use as instructed 08/22/22   Alicia Amel, MD  amoxicillin-clavulanate (AUGMENTIN) 400-57 MG/5ML suspension Take 10.9 mLs (872 mg total) by mouth 2 (two) times daily for 7 days. 12/06/23 12/13/23  Carsin Randazzo E, PA-C  Blood Glucose Monitoring Suppl (ACCU-CHEK GUIDE ME) w/Device KIT 1 kit by Does not apply route as needed. 08/22/22   Alicia Amel, MD  glucose blood (ACCU-CHEK AVIVA) test strip Use to check sugar daily as needed. 08/22/22   Alicia Amel, MD    Family History Family History  Problem Relation Age of Onset   Colon cancer Neg Hx    Esophageal cancer Neg Hx    Stomach cancer Neg Hx    Rectal cancer Neg Hx     Social History Social History   Tobacco Use   Smoking status: Never    Passive exposure: Never   Smokeless tobacco: Never  Vaping Use   Vaping status: Never Used  Substance Use Topics   Alcohol use: No   Drug use: No     Allergies   Patient has no known allergies.   Review of Systems Review of Systems  Constitutional:  Negative for chills and fever.  HENT:  Positive for dental problem. Negative for mouth sores.   Respiratory:  Negative for cough and choking.   Musculoskeletal:  Negative for  neck stiffness.     Physical Exam Triage Vital Signs ED Triage Vitals [12/06/23 1657]  Encounter Vitals Group     BP      Systolic BP Percentile      Diastolic BP Percentile      Pulse Rate 88     Resp 18     Temp 97.7 F (36.5 C)     Temp src      SpO2 97 %     Weight      Height      Head Circumference      Peak Flow      Pain Score      Pain Loc      Pain Education      Exclude from Growth Chart    No data found.  Updated Vital Signs Pulse (!) 2   Temp 97.7 F (36.5 C)   Resp 18   LMP 10/23/2021 (Approximate)   SpO2 97%   Visual Acuity Right Eye Distance:   Left Eye Distance:   Bilateral Distance:    Right Eye Near:   Left Eye Near:    Bilateral Near:     Physical Exam Vitals reviewed.  Constitutional:       General: She is awake.     Appearance: Normal appearance. She is well-developed and well-groomed.  HENT:     Head: Normocephalic and atraumatic.     Mouth/Throat:     Lips: Pink.     Mouth: Mucous membranes are moist.     Dentition: Dental tenderness and dental caries present. No gingival swelling or dental abscesses.     Pharynx: Oropharynx is clear. Uvula midline. No posterior oropharyngeal erythema.  Neurological:     General: No focal deficit present.     Mental Status: She is alert and oriented to person, place, and time.  Psychiatric:        Mood and Affect: Mood normal.        Behavior: Behavior normal. Behavior is cooperative.        Thought Content: Thought content normal.        Judgment: Judgment normal.      UC Treatments / Results  Labs (all labs ordered are listed, but only abnormal results are displayed) Labs Reviewed - No data to display  EKG   Radiology No results found.  Procedures Procedures (including critical care time)  Medications Ordered in UC Medications  ketorolac (TORADOL) 30 MG/ML injection 30 mg (30 mg Intramuscular Given 12/06/23 1812)    Initial Impression / Assessment and Plan / UC Course  I have reviewed the triage vital signs and the nursing notes.  Pertinent labs & imaging results that were available during my care of the patient were reviewed by me and considered in my medical decision making (see chart for details).      Final Clinical Impressions(s) / UC Diagnoses   Final diagnoses:  Infected dental caries    Patient presents today with with her mother.  Her mother reports concerns for potential dental pain and infection.  Physical exam demonstrates several dental caries that look like they could be infected.  Given patient's difficulty communicating I am unsure if she is having more extensive pain.  Vitals are overall reassuring at this time.  She does have some sensitivity to percussion of the affected teeth.  Will start  Augmentin p.o. twice daily with weight-based dosing.  Recommend alternating Tylenol and ibuprofen as needed for pain management.  Will give 30  mg Toradol injection today to assist with pain and recommend that she avoids NSAIDs for at least the first 72 hours after the injection.  ED and return precautions were reviewed and provided in after visit summary.  Recommend close follow-up with the dental provider for more definitive management.  Follow-up as needed for progressing or persistent symptoms    Discharge Instructions      Sk[place]igh was evaluated today for potential dental pain.  On exam she has several dental cavities that may be infected.  I have sent in a medication called Augmentin for her to take twice per day for 7 days.  This should help treat any potential infections from the cavities and should help reduce the pain once the infection has been treated.  You can continue to give her Tylenol primary factors instructions to assist with pain.  We have administered a Toradol 30 mg injection to assist with pain.  This injection typically lasts about 7 days.  I do not recommend using NSAIDs during the first 72 hours after the injection to help prevent damage to the kidneys.  Please make sure that she is staying well-hydrated and I recommend avoiding chewy or hard foods as these could cause damage to the teeth.  I have included a list of dental offices that typically accept Medicaid/Medicare.  Please try to call them to establish an appointment in the coming days as a dentist will be able to provide more definitive treatment. If at any point she starts to develop facial swelling, difficulty eating or drinking, more intense pain, fevers or chills please go to the emergency room as these could be signs of a medical emergency.     ED Prescriptions     Medication Sig Dispense Auth. Provider   amoxicillin-clavulanate (AUGMENTIN) 400-57 MG/5ML suspension  (Status: Discontinued) Take 5 mLs (400 mg total) by  mouth 2 (two) times daily for 7 days. 100 mL Alontae Chaloux E, PA-C   amoxicillin-clavulanate (AUGMENTIN) 400-57 MG/5ML suspension Take 10.9 mLs (872 mg total) by mouth 2 (two) times daily for 7 days. 200 mL Kiren Mcisaac E, PA-C      PDMP not reviewed this encounter.   Roselind Messier 12/07/23 1919

## 2023-12-06 NOTE — ED Triage Notes (Signed)
 Family reports Pt points to Rt side of face for dental pain.

## 2023-12-06 NOTE — Discharge Instructions (Signed)
 Stephana was evaluated today for potential dental pain.  On exam she has several dental cavities that may be infected.  I have sent in a medication called Augmentin for her to take twice per day for 7 days.  This should help treat any potential infections from the cavities and should help reduce the pain once the infection has been treated.  You can continue to give her Tylenol primary factors instructions to assist with pain.  We have administered a Toradol 30 mg injection to assist with pain.  This injection typically lasts about 7 days.  I do not recommend using NSAIDs during the first 72 hours after the injection to help prevent damage to the kidneys.  Please make sure that she is staying well-hydrated and I recommend avoiding chewy or hard foods as these could cause damage to the teeth.  I have included a list of dental offices that typically accept Medicaid/Medicare.  Please try to call them to establish an appointment in the coming days as a dentist will be able to provide more definitive treatment. If at any point she starts to develop facial swelling, difficulty eating or drinking, more intense pain, fevers or chills please go to the emergency room as these could be signs of a medical emergency.

## 2023-12-19 ENCOUNTER — Telehealth: Payer: Self-pay | Admitting: Student

## 2023-12-19 NOTE — Telephone Encounter (Signed)
 Placed in MDs box to be filled out. [name] Bruna Potter, CMA

## 2023-12-19 NOTE — Telephone Encounter (Signed)
 Patient's mother dropped off FL2 form to be completed. Last DOS was 10/08/23. Placed in [employer].

## 2023-12-24 NOTE — Telephone Encounter (Signed)
 Received FL2 in nurse box.   Called and LVM for mother advising that form was ready for pick up.   Copy made and placed in batch scanning.   Elsie Halo, RN

## 2024-01-27 IMAGING — MG MM DIGITAL SCREENING BILAT W/ TOMO AND CAD
6 of 10 series · 6 of 30 positions shown · non-contrast
Comparison: None available.

CLINICAL DATA: Screening.

EXAM:
DIGITAL SCREENING BILATERAL MAMMOGRAM WITH TOMOSYNTHESIS AND CAD
TECHNIQUE: Bilateral screening digital craniocaudal and mediolateral oblique
mammograms were obtained. Bilateral screening digital breast
tomosynthesis was performed. The images were evaluated with
computer-aided detection.

[L MLO synth-2D (1 of 2)]
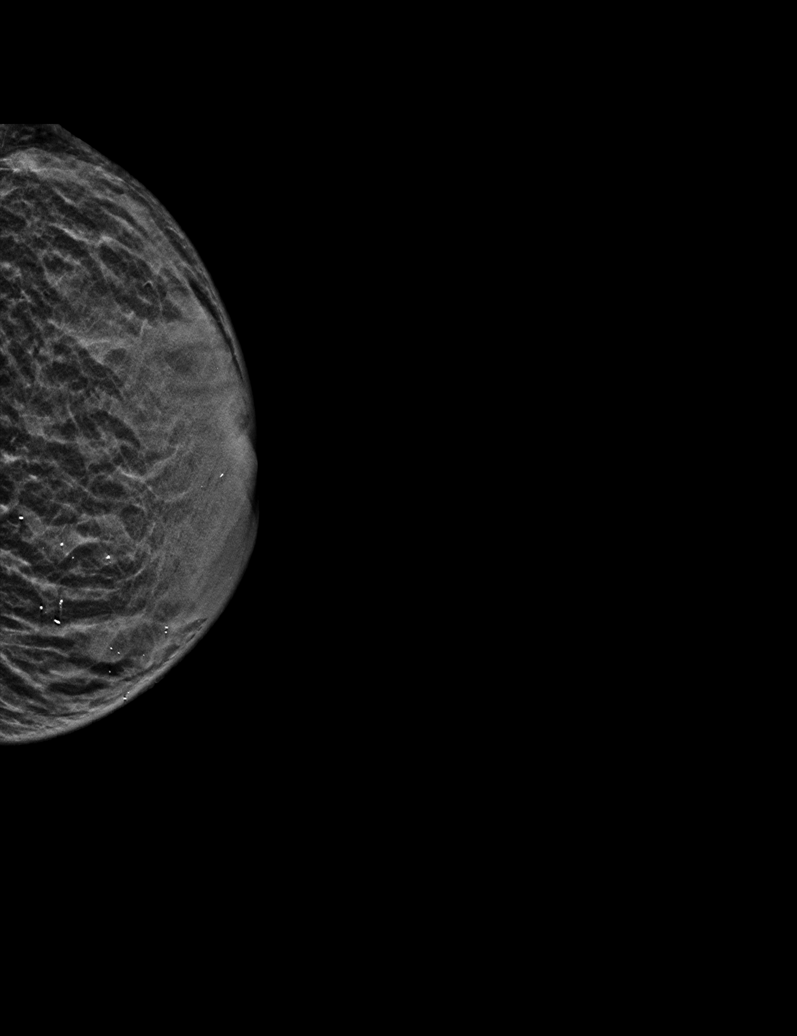

[L MLO synth-2D (2 of 2)]
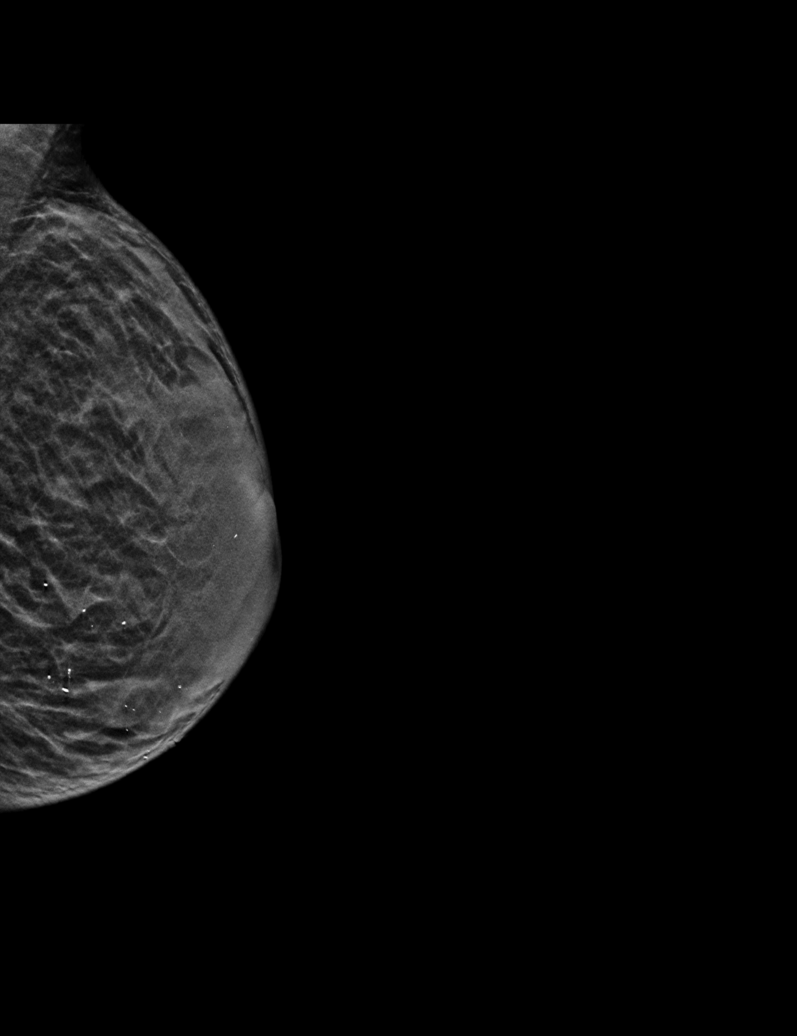

[L CC synth-2D]
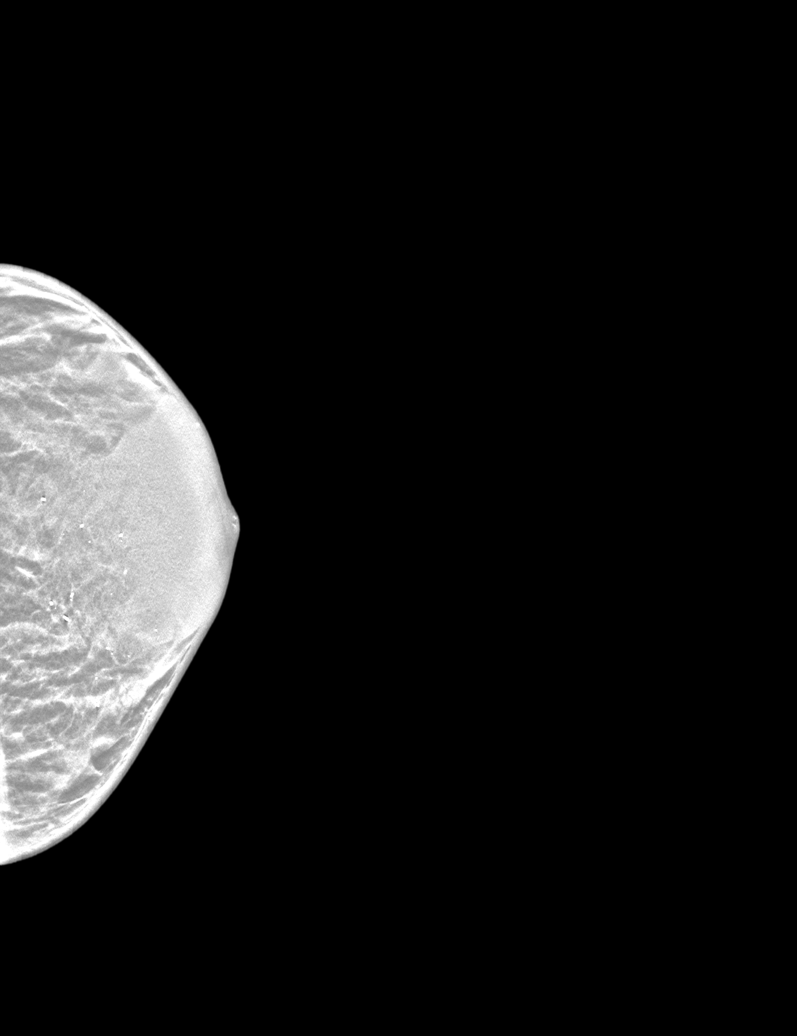

[R MLO synth-2D]
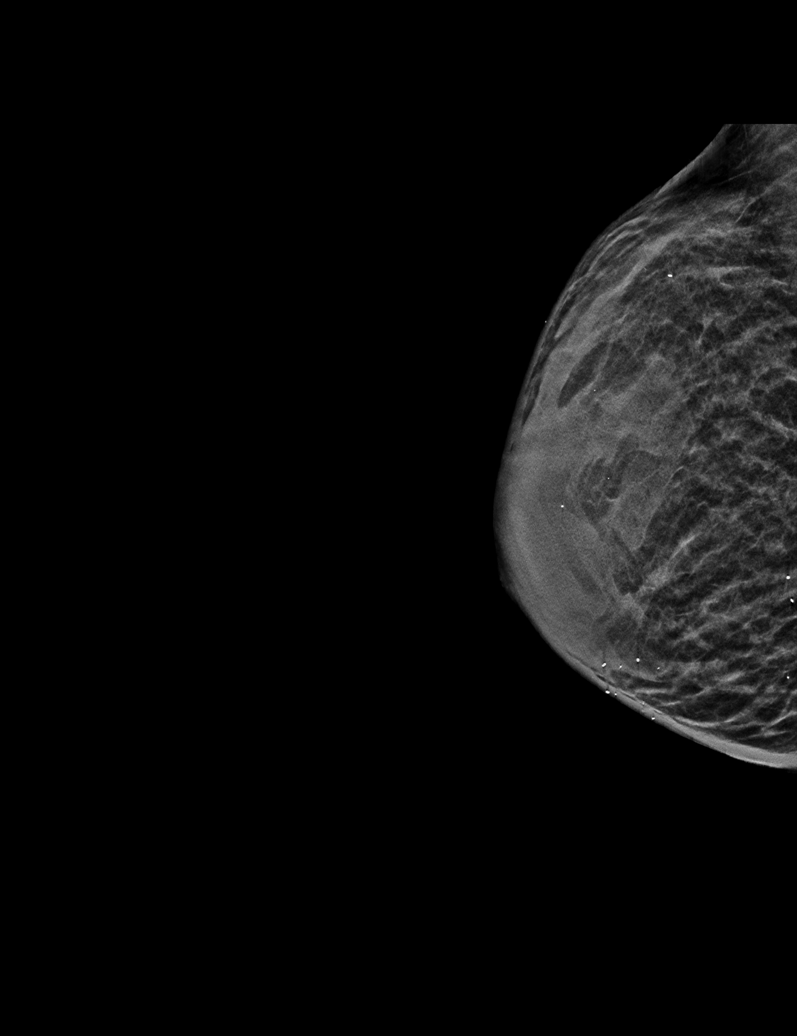

[R CC synth-2D]
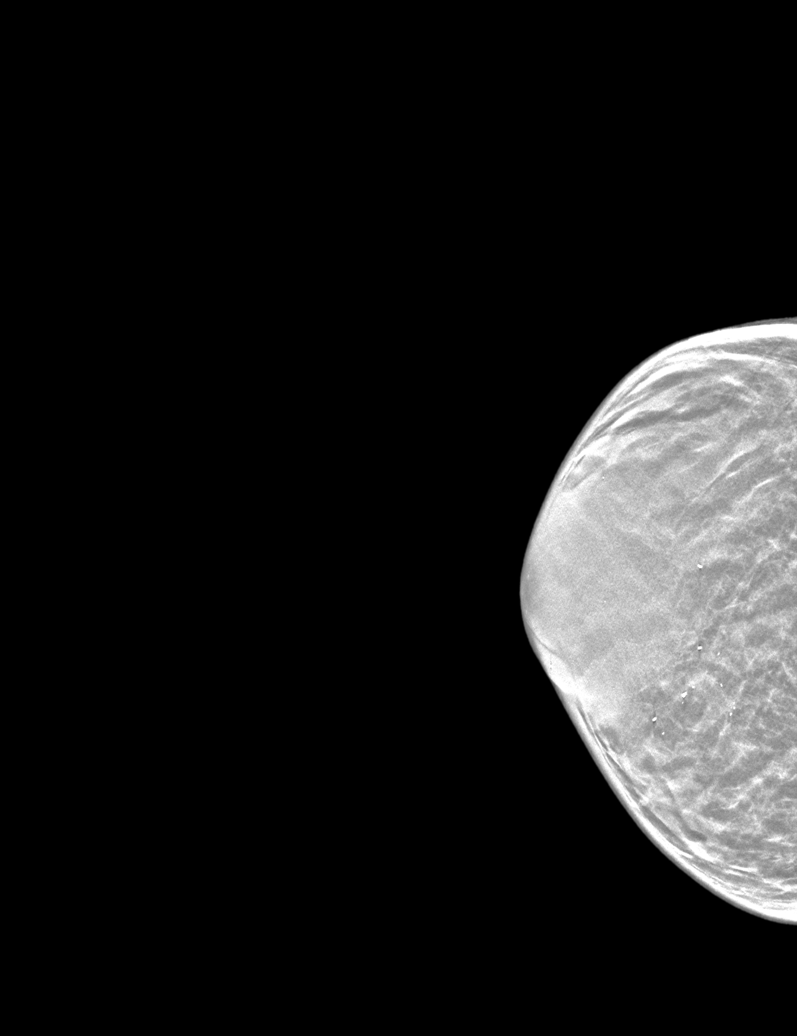

[R MLO tomo · tomo slice 13/25.0]
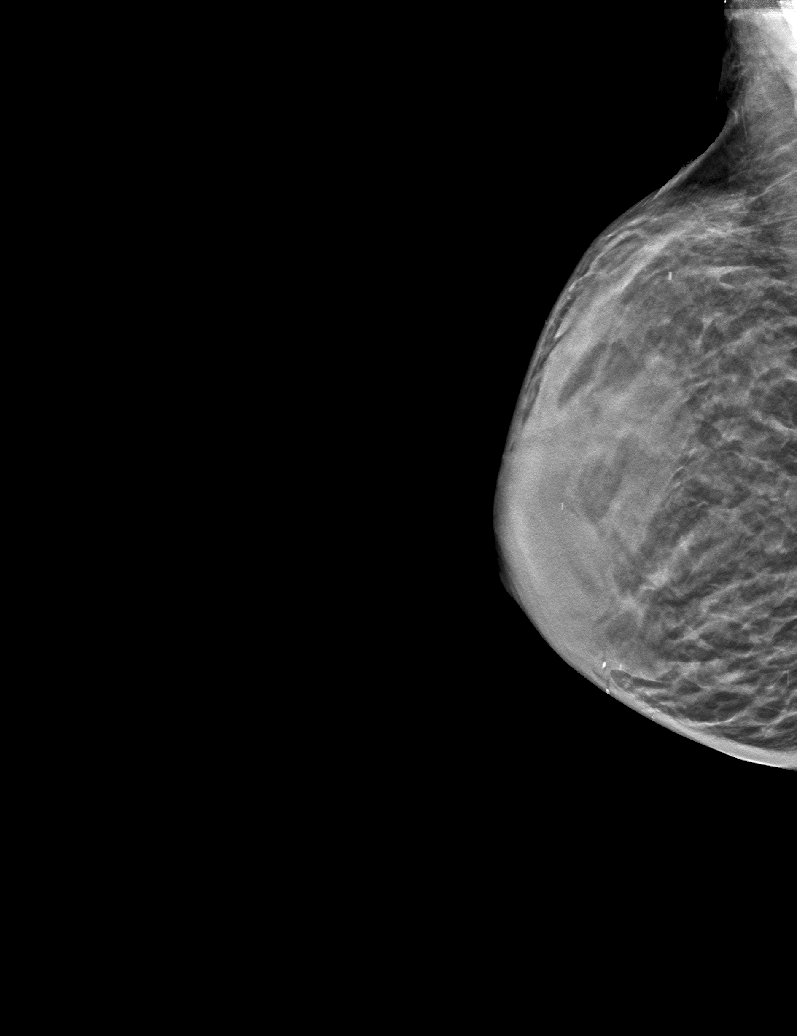

[6 of 30 positions shown; findings below may reference images not displayed]

ACR Breast Density Category d: The breast tissue is extremely dense,
which lowers the sensitivity of mammography.
FINDINGS: There are no findings suspicious for malignancy.
IMPRESSION: No mammographic evidence of malignancy. A result letter of this
screening mammogram will be mailed directly to the patient.

RECOMMENDATION:
Screening mammogram in one year. (Code:KG-P-SWW)

BI-RADS CATEGORY  1: Negative.

## 2024-02-04 ENCOUNTER — Other Ambulatory Visit: Payer: Self-pay | Admitting: Student

## 2024-02-04 DIAGNOSIS — Q909 Down syndrome, unspecified: Secondary | ICD-10-CM

## 2024-02-11 ENCOUNTER — Encounter: Payer: Self-pay | Admitting: *Deleted

## 2024-04-20 ENCOUNTER — Ambulatory Visit (INDEPENDENT_AMBULATORY_CARE_PROVIDER_SITE_OTHER)

## 2024-04-20 VITALS — Ht 59.0 in | Wt 80.0 lb

## 2024-04-20 DIAGNOSIS — Z0000 Encounter for general adult medical examination without abnormal findings: Secondary | ICD-10-CM | POA: Diagnosis not present

## 2024-04-20 NOTE — Progress Notes (Signed)
 Because this visit was a virtual/telehealth visit,  certain criteria was not obtained, such a blood pressure, CBG if applicable, and timed get up and go. Any medications not marked as taking were not mentioned during the medication reconciliation part of the visit. Any vitals not documented were not able to be obtained due to this being a telehealth visit or patient was unable to self-report a recent blood pressure reading due to a lack of equipment at home via telehealth. Vitals that have been documented are verbally provided by the patient.   Subjective:   [name] is a 53 y.o. who presents for a Medicare Wellness preventive visit.  As a reminder, Annual Wellness Visits don't include a physical exam, and some assessments may be limited, especially if this visit is performed virtually. We may recommend an in-person follow-up visit with your provider if needed.  Visit Complete: Virtual I connected with  [name] [name] on 04/20/24 by a audio enabled telemedicine application and verified that I am speaking with the correct person using two identifiers.  Patient Location: Home  Provider Location: Home Office  I discussed the limitations of evaluation and management by telemedicine. The patient expressed understanding and agreed to proceed.  Vital Signs: Because this visit was a virtual/telehealth visit, some criteria may be missing or patient reported. Any vitals not documented were not able to be obtained and vitals that have been documented are patient reported.  VideoDeclined- This patient declined Librarian, academic. Therefore the visit was completed with audio only.  Persons Participating in Visit: Patient assisted by Legal Guardian, [name].  AWV Questionnaire: No: Patient Medicare AWV questionnaire was not completed prior to this visit.  Cardiac Risk Factors include: dyslipidemia;sedentary lifestyle     Objective:    Today's Vitals   04/20/24  1155  Weight: 80 lb (36.3 kg)  Height: 4' 11 (1.499 m)   Body mass index is 16.16 kg/m.     04/20/2024   11:58 AM 06/27/2023   10:39 AM 06/27/2023   10:36 AM 05/16/2023    1:40 PM 04/17/2023    9:14 AM 02/15/2023    9:07 AM 02/11/2023    2:21 PM  Advanced Directives  Does Patient Have a Medical Advance Directive? No No No No No No No  Would patient like information on creating a medical advance directive? No - Patient declined No - Patient declined No - Patient declined No - Patient declined No - Patient declined No - Patient declined Yes (MAU/Ambulatory/Procedural Areas - Information given)    Current Medications (verified) Outpatient Encounter Medications as of 04/20/2024  Medication Sig   Accu-Chek Softclix Lancets lancets Use as instructed   Blood Glucose Monitoring Suppl (ACCU-CHEK GUIDE ME) w/Device KIT 1 kit by Does not apply route as needed.   clonazePAM  (KLONOPIN ) 0.5 MG tablet TAKE 1 TABLET(0.5 MG) BY MOUTH DAILY   glucose blood (ACCU-CHEK AVIVA) test strip Use to check sugar daily as needed.   Multiple Vitamin (MULTIVITAMIN) LIQD Take 5 mLs by mouth daily.   traZODone  (DESYREL ) 50 MG tablet TAKE 1/2 TO 1 TABLET BY MOUTH AT BEDTIME AS NEEDED FOR INSOMNIA   No facility-administered encounter medications on file as of 04/20/2024.    Allergies (verified) Patient has no known allergies.   History: Past Medical History:  Diagnosis Date   Down syndrome    DVT (deep venous thrombosis) (HCC) 11/05/2018   Provoked by OCP   Hyperlipidemia    Mood disorder (HCC)  Obesity    PCOS (polycystic ovarian syndrome)    Past Surgical History:  Procedure Laterality Date   CYSTOSCOPY     Family History  Problem Relation Age of Onset   Colon cancer Neg Hx    Esophageal cancer Neg Hx    Stomach cancer Neg Hx    Rectal cancer Neg Hx    Social History   Socioeconomic History   Marital status: Single    Spouse name: Not on file   Number of children: Not on file   Years of  education: Not on file   Highest education level: Not on file  Occupational History   Occupation: disable  Tobacco Use   Smoking status: Never    Passive exposure: Never   Smokeless tobacco: Never  Vaping Use   Vaping status: Never Used  Substance and Sexual Activity   Alcohol use: No   Drug use: No   Sexual activity: Never  Other Topics Concern   Not on file  Social History Narrative   Jennings lives in a 3 floor home with two steps into home.    She enjoys going to [employer], movies, reading, doing puzzles, and listening to music.    Fronie Rattler, her mother, is Marshal's primary caregiver. Gwen Eldred drives Quynn to appointments.    There is a brother of E[place]ria who can help out as needed.       Ronee and her mother are vegetarians.       Kyleigha completed her high school under exception children programs    Social Drivers of Health   Financial Resource Strain: Low Risk  (04/20/2024)   Overall Financial Resource Strain (CARDIA)    Difficulty of Paying Living Expenses: Not hard at all  Food Insecurity: No Food Insecurity (04/20/2024)   Hunger Vital Sign    Worried About Running Out of Food in the Last Year: Never true    Ran Out of Food in the Last Year: Never true  Transportation Needs: No Transportation Needs (04/20/2024)   PRAPARE - Administrator, Civil Service (Medical): No    Lack of Transportation (Non-Medical): No  Physical Activity: Inactive (04/20/2024)   Exercise Vital Sign    Days of Exercise per Week: 0 days    Minutes of Exercise per Session: 0 min  Stress: No Stress Concern Present (04/20/2024)   [employer] of Occupational Health - Occupational Stress Questionnaire    Feeling of Stress: Not at all  Social Connections: Moderately Isolated (04/20/2024)   Social Connection and Isolation Panel    Frequency of Communication with Friends and Family: Once a week    Frequency of Social Gatherings with Friends and Family: Three times a week    Attends Religious  Services: More than 4 times per year    Active Member of Clubs or Organizations: No    Attends Banker Meetings: Never    Marital Status: Never married    Tobacco Counseling Counseling given: Not Answered    Clinical Intake:  Pre-visit preparation completed: Yes  Pain : No/denies pain     BMI - recorded: 16.16 Nutritional Status: BMI <19  Underweight Nutritional Risks: Unintentional weight loss Diabetes: No  Lab Results  Component Value Date   HGBA1C 5.1 08/22/2022   HGBA1C 5.0 11/07/2021   HGBA1C 5.5 03/28/2021     How often do you need to have someone help you when you read instructions, pamphlets, or other written materials from your doctor or pharmacy?: 5 -  Always  Interpreter Needed?: No  Information entered by :: Tiya Schrupp N. Kazuma Elena, LPN.   Activities of Daily Living     04/20/2024   11:58 AM  In your present state of health, do you have any difficulty performing the following activities:  Hearing? 0  Vision? 0  Difficulty concentrating or making decisions? 1  Walking or climbing stairs? 0  Dressing or bathing? 1  Doing errands, shopping? 1  Preparing Food and eating ? Y  Using the Toilet? N  In the past six months, have you accidently leaked urine? N  Do you have problems with loss of bowel control? N  Managing your Medications? Y  Managing your Finances? Y  Housekeeping or managing your Housekeeping? Y    Patient Care Team: Larraine Palma, MD as PCP - General (Family Medicine)  I have updated your Care Teams any recent Medical Services you may have received from other providers in the past year.     Assessment:   This is a routine wellness examination for [facility].  Hearing/Vision screen Hearing Screening - Comments:: Denies hearing difficulties.  Vision Screening - Comments:: Patient does not wear any eyeglasses.   Goals Addressed             This Visit's Progress    04/20/2024: Patient's legal guardian aware of keeping  appointments with providers.         Depression Screen     04/20/2024   12:01 PM 08/08/2023   10:23 AM 06/27/2023   10:39 AM 02/15/2023    9:07 AM 02/11/2023    2:20 PM 10/31/2022   10:08 AM 09/19/2022   10:35 AM  PHQ 2/9 Scores  PHQ - 2 Score  0 0 0 0 0 0  PHQ- 9 Score  0 0   0 0  Exception Documentation Other- indicate reason in comment box        Not completed Patient's lega; guardian stated no issues with depression          Fall Risk     04/20/2024   11:58 AM 08/08/2023   10:24 AM 02/15/2023    9:07 AM 02/11/2023    2:20 PM 09/19/2022   10:03 AM  Fall Risk   Falls in the past year? 0 0 0 0 0  Number falls in past yr: 0 0  0 0  Injury with Fall? 0 0  0 0  Risk for fall due to : No Fall Risks   No Fall Risks   Follow up Falls evaluation completed   Falls prevention discussed;Education provided;Falls evaluation completed     MEDICARE RISK AT HOME:  Medicare Risk at Home Any stairs in or around the home?: Yes If so, are there any without handrails?: No Home free of loose throw rugs in walkways, pet beds, electrical cords, etc?: Yes Adequate lighting in your home to reduce risk of falls?: Yes Life alert?: No Use of a cane, walker or w/c?: No Grab bars in the bathroom?: No Shower chair or bench in shower?: No Elevated toilet seat or a handicapped toilet?: No  TIMED UP AND GO:  Was the test performed?  No  Cognitive Function: Unable: Due to language barrier, hearing or vision limitations or other.    04/20/2024   12:00 PM  MMSE - Mini Mental State Exam  Not completed: Unable to complete        02/11/2023    2:21 PM  6CIT Screen  What Year? 0 points  What month?  0 points  What time? 0 points  Count back from 20 0 points  Months in reverse 4 points  Repeat phrase 10 points  Total Score 14 points    Immunizations Immunization History  Administered Date(s) Administered   Influenza,inj,Quad PF,6+ Mos 09/26/2022   PFIZER(Purple Top)SARS-COV-2 Vaccination  11/27/2019, 12/18/2019, 07/02/2020    Screening Tests Health Maintenance  Topic Date Due   DTaP/Tdap/Td (1 - Tdap) Never done   Hepatitis B Vaccines 19-59 Average Risk (1 of 3 - 19+ 3-dose series) Never done   Zoster Vaccines- Shingrix (1 of 2) Never done   Cervical Cancer Screening (HPV/Pap Cotest)  Never done   Pneumococcal Vaccine: 50+ Years (1 of 1 - PCV) Never done   COVID-19 Vaccine (4 - 2024-25 season) 05/05/2023   MAMMOGRAM  02/02/2024   INFLUENZA VACCINE  04/03/2024   Medicare Annual Wellness (AWV)  04/20/2025   Colonoscopy  04/11/2032   Hepatitis C Screening  Completed   HIV Screening  Completed   HPV VACCINES  Aged Out   Meningococcal B Vaccine  Aged Out    Health Maintenance  Health Maintenance Due  Topic Date Due   DTaP/Tdap/Td (1 - Tdap) Never done   Hepatitis B Vaccines 19-59 Average Risk (1 of 3 - 19+ 3-dose series) Never done   Zoster Vaccines- Shingrix (1 of 2) Never done   Cervical Cancer Screening (HPV/Pap Cotest)  Never done   Pneumococcal Vaccine: 50+ Years (1 of 1 - PCV) Never done   COVID-19 Vaccine (4 - 2024-25 season) 05/05/2023   MAMMOGRAM  02/02/2024   INFLUENZA VACCINE  04/03/2024   Health Maintenance Items Addressed: Yes Guardian is aware of current care gaps.Immunization record was verified by [facility] and updated in patient's chart.  Additional Screening:  Vision Screening: Recommended annual ophthalmology exams for early detection of glaucoma and other disorders of the eye. Would you like a referral to an eye doctor? No    Dental Screening: Recommended annual dental exams for proper oral hygiene  Community Resource Referral / Chronic Care Management: CRR required this visit?  No   CCM required this visit?  No   Plan:    I have personally reviewed and noted the following in the patient's chart:   Medical and social history Use of alcohol, tobacco or illicit drugs  Current medications and supplements including opioid prescriptions.  Patient is not currently taking opioid prescriptions. Functional ability and status Nutritional status Physical activity Advanced directives List of other physicians Hospitalizations, surgeries, and ER visits in previous 12 months Vitals Screenings to include cognitive, depression, and falls Referrals and appointments  In addition, I have reviewed and discussed with patient certain preventive protocols, quality metrics, and best practice recommendations. A written personalized care plan for preventive services as well as general preventive health recommendations were provided to patient.   Roz LOISE Fuller, LPN   [id number]   After Visit Summary: (MyChart) Due to this being a telephonic visit, the after visit summary with patients personalized plan was offered to patient via MyChart   Notes: Patient is due for a mammogram and various vaccines.

## 2024-04-20 NOTE — Patient Instructions (Addendum)
 [name] , Thank you for taking time out of your busy schedule to complete your Annual Wellness Visit with me. I enjoyed our conversation and look forward to speaking with you again next year. I, as well as your care team,  appreciate your ongoing commitment to your health goals. Please review the following plan we discussed and let me know if I can assist you in the future. Your Game plan/ To Do List    Referrals: If you haven't heard from the office you've been referred to, please reach out to them at the phone provided.   Follow up Visits: We will see or speak with you next year for your Next Medicare AWV with our clinical staff Have you seen your provider in the last 6 months (3 months if uncontrolled diabetes)? Yes  Clinician Recommendations:  Aim for 30 minutes of exercise or brisk walking, 6-8 glasses of water, and 5 servings of fruits and vegetables each day.       This is a list of the screenings recommended for you:  Health Maintenance  Topic Date Due   DTaP/Tdap/Td vaccine (1 - Tdap) Never done   Hepatitis B Vaccine (1 of 3 - 19+ 3-dose series) Never done   Zoster (Shingles) Vaccine (1 of 2) Never done   Pap with HPV screening  Never done   Pneumococcal Vaccine for age over 46 (1 of 1 - PCV) Never done   COVID-19 Vaccine (4 - 2024-25 season) 05/05/2023   Mammogram  02/02/2024   Flu Shot  04/03/2024   Medicare Annual Wellness Visit  04/20/2025   Colon Cancer Screening  04/11/2032   Hepatitis C Screening  Completed   HIV Screening  Completed   HPV Vaccine  Aged Out   Meningitis B Vaccine  Aged Out    Advanced directives: (Declined) Advance directive discussed with you today. Even though you declined this today, please call our office should you change your mind, and we can give you the proper paperwork for you to fill out. Advance Care Planning is important because it:  [x]  Makes sure you receive the medical care that is consistent with your values, goals, and  preferences  [x]  It provides guidance to your family and loved ones and reduces their decisional burden about whether or not they are making the right decisions based on your wishes.  Follow the link provided in your after visit summary or read over the paperwork we have mailed to you to help you started getting your Advance Directives in place. If you need assistance in completing these, please reach out to us  so that we can help you!  See attachments for Preventive Care and Fall Prevention Tips.

## 2024-06-29 ENCOUNTER — Other Ambulatory Visit: Payer: Self-pay

## 2024-06-29 DIAGNOSIS — Q909 Down syndrome, unspecified: Secondary | ICD-10-CM

## 2024-06-29 MED ORDER — CLONAZEPAM 0.5 MG PO TABS: 0.5000 mg | ORAL_TABLET | Freq: Every day | ORAL | 0 refills | Status: DC

## 2024-07-27 ENCOUNTER — Other Ambulatory Visit: Payer: Self-pay | Admitting: *Deleted

## 2024-07-27 DIAGNOSIS — G4700 Insomnia, unspecified: Secondary | ICD-10-CM

## 2024-07-27 DIAGNOSIS — Q909 Down syndrome, unspecified: Secondary | ICD-10-CM

## 2024-07-27 MED ORDER — TRAZODONE HCL 50 MG PO TABS: ORAL_TABLET | ORAL | 1 refills | Status: AC

## 2024-10-08 ENCOUNTER — Other Ambulatory Visit: Payer: Self-pay

## 2024-10-08 DIAGNOSIS — Q909 Down syndrome, unspecified: Secondary | ICD-10-CM

## 2024-10-08 NOTE — Telephone Encounter (Signed)
 Refilling pt's clonazepam  for 1 month after reviewing PDMP and ensuring refill appropriate. Patient is due for a follow-up appointment, will have her come in to be seen.  Admin team, please help get patient scheduled for an appointment within the next month; please let her/family know that she will need to be seen prior to further clonazepam  refills.

## 2025-04-22 ENCOUNTER — Encounter
